# Patient Record
Sex: Female | Born: 1990 | Race: Black or African American | Hispanic: No | Marital: Married | Smoking: Never smoker
Health system: Southern US, Community
[De-identification: ages and names within clinical notes are randomized; demographics above are authoritative.]

## PROBLEM LIST (undated history)

## (undated) DIAGNOSIS — J301 Allergic rhinitis due to pollen: Secondary | ICD-10-CM

## (undated) DIAGNOSIS — R51 Headache: Secondary | ICD-10-CM

## (undated) DIAGNOSIS — R519 Headache, unspecified: Secondary | ICD-10-CM

## (undated) DIAGNOSIS — I10 Essential (primary) hypertension: Secondary | ICD-10-CM

## (undated) HISTORY — PX: NO PAST SURGERIES: SHX2092

## (undated) HISTORY — DX: Headache: R51

## (undated) HISTORY — DX: Essential (primary) hypertension: I10

## (undated) HISTORY — DX: Headache, unspecified: R51.9

## (undated) HISTORY — DX: Allergic rhinitis due to pollen: J30.1

---
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
## 2010-05-19 NOTE — Discharge Summary (Signed)
NAME:  Alexa Hicks, Alexa Hicks             ACCOUNT NO.:  [id number]   MEDICAL RECORD NO.:  [id number]          PATIENT TYPE:  INP   LOCATION:  9102                          FACILITY:  [facility]   PHYSICIAN:  Huel Cote, M.D. DATE OF BIRTH:  07-Dec-1990   DATE OF ADMISSION:  11/27/2007  DATE OF DISCHARGE:  11/29/2007                               DISCHARGE SUMMARY   DISCHARGE DIAGNOSES:  1. Term pregnancy at 39 weeks, delivered.  2. Status post normal spontaneous vaginal delivery.  3. Small for gestational age, although was measuring 20th percentile      on ultrasound.   DISCHARGE MEDICATIONS:  Motrin 600 mg p.o. every 6 hours.   DISCHARGE FOLLOWUP:  The patient is to follow up in my office in 6 weeks  for her routine postpartum exam.   HOSPITAL COURSE:  The patient is a 20 year old, G1, P0, who was admitted  at 44 weeks' gestation with contractions every 2-5 minutes and cervical  change from 2 cm to 3-4.  She was admitted and continued to contract  well.  She have prenatal care complicated by late start at 23 weeks with  relatively poor dating as well as a teen pregnancy.  She is small for  gestational age, infant with estimated fetal weight in the 15th to 20th  percentile on last ultrasound.   PRENATAL LABS:  Are as follows.  B positive antibody negative, rubella  immune, hepatitis B negative, HIV negative, GC negative, chlamydia  negative, and group B strep negative.  One-hour Glucola normal.  She was  too late for any genetic screening.   PAST OBSTETRICAL HISTORY:  None.   PAST GYN HISTORY:  No abnormal Pap smears.   PAST GYN HISTORY:  None.   PAST MEDICAL HISTORY:  None.   No known drug allergies.   MEDICATIONS:  Prenatal vitamins.   Blood pressures 140s/90s.  Cardiac exam is regular rate and rhythm.  On  pelvic exam, she had cervix that was 80 and 4-5 at zero.  Shortly after  admission, she had rupture of membranes performed due to some borderline  blood pressures on  admission, which were probably secondary to pain.  She had pre-eclamptic labs drawn, which were normal.  These blood  pressures normalized after she received her epidural and was  comfortable.  She continued to contract well after rupture of membranes  and reached complete dilation.  She pushed well with a normal  spontaneous vaginal delivery of a vigorous female infant over a first-  degree laceration.  Weight was 5 pounds 8 ounces.  Apgars are 8 and 9.  Placenta delivered spontaneously to a small first-degree laceration,  which was repaired with 2-0 Vicryl for hemostasis.  Estimated blood loss  was 350 mL.  She did great.  By postpartum day #1, her  hemoglobin was 10.1.  Her pain was well controlled.  She had been  postpartum for greater than 24 hours and requested an early discharge.  Therefore, she was allowed discharge home to follow up in the office in  6 weeks.  She was instructed on pelvic rest and birth control and  we  will call to make her appointment.      Huel Cote, M.D.  Electronically Signed     KR/MEDQ  D:  11/29/2007  T:  11/29/2007  Job:  [id number]

## 2010-10-07 LAB — CBC
HCT: 29.5 — ABNORMAL LOW
HCT: 35.7 — ABNORMAL LOW
Hemoglobin: 12.6
MCHC: 34.1
MCV: 84.1
Platelets: 194
RBC: 4.24
RDW: 13.7
WBC: 13.5 — ABNORMAL HIGH

## 2010-10-07 LAB — COMPREHENSIVE METABOLIC PANEL
ALT: 8
Alkaline Phosphatase: 211 — ABNORMAL HIGH
BUN: 3 — ABNORMAL LOW
Chloride: 103
Glucose, Bld: 72
Potassium: 4.3
Sodium: 134 — ABNORMAL LOW
Total Bilirubin: 0.6

## 2010-10-07 LAB — LACTATE DEHYDROGENASE: LDH: 206

## 2010-10-07 LAB — URIC ACID: Uric Acid, Serum: 6.2

## 2014-08-15 ENCOUNTER — Ambulatory Visit (INDEPENDENT_AMBULATORY_CARE_PROVIDER_SITE_OTHER): Payer: BLUE CROSS/BLUE SHIELD | Admitting: Emergency Medicine

## 2014-08-15 VITALS — BP 102/62 | HR 76 | Temp 98.7°F | Resp 14 | Ht 66.0 in | Wt 123.0 lb

## 2014-08-15 DIAGNOSIS — Z0000 Encounter for general adult medical examination without abnormal findings: Secondary | ICD-10-CM

## 2014-08-15 DIAGNOSIS — Z111 Encounter for screening for respiratory tuberculosis: Secondary | ICD-10-CM | POA: Diagnosis not present

## 2014-08-15 NOTE — Progress Notes (Signed)
Subjective:  Patient ID: Alexa Hicks, female    DOB: [birth year]  Age: 24 y.o. 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 She is uncertain of the she's had a hep B immunization by history. She's not been exposed to tuberculosis and she knows of. She takes no medication has no cronic or acut medicl problems  History Alexa Hicks has no past medical history on file.   She has no past surgical history on file.   Her  family history is not on file.  She   reports that she has never smoked. She does not have any smokeless tobacco history on file. She reports that she drinks about 1.2 oz of alcohol per week. She reports that she does not use illicit drugs.  No outpatient prescriptions prior to visit.   No facility-administered medications prior to visit.    Social History   Social History  . Marital Status: Single    Spouse Name: N/A  . Number of Children: N/A  . Years of Education: N/A   Social History Main Topics  . Smoking status: Never Smoker   . Smokeless tobacco: None  . Alcohol Use: 1.2 oz/week    2 Standard drinks or equivalent per week  . Drug Use: No  . Sexual Activity: Not Asked   Other Topics Concern  . None   Social History Narrative  . None     Review of Systems  Constitutional: Negative for fever, chills and appetite change.  HENT: Negative for congestion, ear pain, postnasal drip, sinus pressure and sore throat.   Eyes: Negative for pain and redness.  Respiratory: Negative for cough, shortness of breath and wheezing.   Cardiovascular: Negative for leg swelling.  Gastrointestinal: Negative for nausea, vomiting, abdominal pain, diarrhea, constipation and blood in stool.  Endocrine: Negative for polyuria.  Genitourinary: Negative for dysuria, urgency, frequency and flank pain.  Musculoskeletal: Negative for gait problem.  Skin:  Negative for rash.  Neurological: Negative for weakness and headaches.  Psychiatric/Behavioral: Negative for confusion and decreased concentration. The patient is not nervous/anxious.     Objective:  BP 102/62 mmHg  Pulse 76  Temp(Src) 98.7 F (37.1 C) (Oral)  Resp 14  Ht 5\' 6"  (1.676 m)  Wt 123 lb (55.792 kg)  BMI 19.86 kg/m2  SpO2 98%  Physical Exam  Constitutional: She is oriented to person, place, and time. She appears well-developed and well-nourished. No distress.  HENT:  Head: Normocephalic and atraumatic.  Right Ear: External ear normal.  Left Ear: External ear normal.  Nose: Nose normal.  Eyes: Conjunctivae and EOM are normal. Pupils are equal, round, and reactive to light. No scleral icterus.  Neck: Normal range of motion. Neck supple. No tracheal deviation present.  Cardiovascular: Normal rate, regular rhythm and normal heart sounds.   Pulmonary/Chest: Effort normal. No respiratory distress. She has no wheezes. She has no rales.  Abdominal: She exhibits no mass. There is no tenderness. There is no rebound and no guarding.  Musculoskeletal: She exhibits no edema.  Lymphadenopathy:    She has no cervical adenopathy.  Neurological: She is alert and oriented to person, place, and time. Coordination normal.  Skin: Skin is warm and dry. No rash noted.  Psychiatric: She has a normal mood and affect. Her behavior is normal.      Assessment & Plan:   Alexa Hicks was seen today for annual  exam and immunizations.  Diagnoses and all orders for this visit:  Annual physical exam  Screening examination for pulmonary tuberculosis -     TB Skin Test   Ms. Alexa Hicks does not currently have medications on file.  No orders of the defined types were placed in this encounter.     She was found to have received her complete hepatitis B immunization series 3 shots in 2009.  She knows to return in 2 days for reading her PPD  Appropriate red flag conditions were discussed with the  patient as well as actions that should be taken.  Patient expressed his understanding.  Follow-up: No Follow-up on file.  Alexa Dane, MD

## 2014-08-15 NOTE — Patient Instructions (Signed)

## 2014-08-17 ENCOUNTER — Ambulatory Visit (INDEPENDENT_AMBULATORY_CARE_PROVIDER_SITE_OTHER): Payer: BLUE CROSS/BLUE SHIELD

## 2014-08-17 DIAGNOSIS — Z7689 Persons encountering health services in other specified circumstances: Secondary | ICD-10-CM

## 2014-08-17 DIAGNOSIS — Z111 Encounter for screening for respiratory tuberculosis: Secondary | ICD-10-CM

## 2014-08-17 LAB — TB SKIN TEST
INDURATION: 0 mm
TB SKIN TEST: NEGATIVE

## 2015-12-03 ENCOUNTER — Ambulatory Visit (INDEPENDENT_AMBULATORY_CARE_PROVIDER_SITE_OTHER): Payer: BLUE CROSS/BLUE SHIELD | Admitting: Family Medicine

## 2015-12-03 VITALS — BP 116/72 | HR 83 | Temp 98.2°F | Resp 17 | Ht 66.0 in | Wt 134.0 lb

## 2015-12-03 DIAGNOSIS — Z0000 Encounter for general adult medical examination without abnormal findings: Secondary | ICD-10-CM | POA: Diagnosis not present

## 2015-12-03 LAB — POCT URINALYSIS DIP (MANUAL ENTRY)
BILIRUBIN UA: NEGATIVE
Bilirubin, UA: NEGATIVE
GLUCOSE UA: NEGATIVE
Leukocytes, UA: NEGATIVE
Nitrite, UA: NEGATIVE
Protein Ur, POC: NEGATIVE
RBC UA: NEGATIVE
SPEC GRAV UA: 1.025
Urobilinogen, UA: 0.2
pH, UA: 6.5

## 2015-12-03 LAB — POC MICROSCOPIC URINALYSIS (UMFC): MUCUS RE: ABSENT

## 2015-12-03 LAB — POCT URINE PREGNANCY: Preg Test, Ur: NEGATIVE

## 2015-12-03 NOTE — Patient Instructions (Addendum)
Exercising to Stay Healthy Introduction Exercising regularly is important. It has many health benefits, such as:  Improving your overall fitness, flexibility, and endurance.  Increasing your bone density.  Helping with weight control.  Decreasing your body fat.  Increasing your muscle strength.  Reducing stress and tension.  Improving your overall health. In order to become healthy and stay healthy, it is recommended that you do moderate-intensity and vigorous-intensity exercise. You can tell that you are exercising at a moderate intensity if you have a higher heart rate and faster breathing, but you are still able to hold a conversation. You can tell that you are exercising at a vigorous intensity if you are breathing much harder and faster and cannot hold a conversation while exercising. How often should I exercise? Choose an activity that you enjoy and set realistic goals. Your health care provider can help you to make an activity plan that works for you. Exercise regularly as directed by your health care provider. This may include:  Doing resistance training twice each week, such as:  Push-ups.  Sit-ups.  Lifting weights.  Using resistance bands.  Doing a given intensity of exercise for a given amount of time. Choose from these options:  150 minutes of moderate-intensity exercise every week.  75 minutes of vigorous-intensity exercise every week.  A mix of moderate-intensity and vigorous-intensity exercise every week. Children, pregnant women, people who are out of shape, people who are overweight, and older adults may need to consult a health care provider for individual recommendations. If you have any sort of medical condition, be sure to consult your health care provider before starting a new exercise program. What are some exercise ideas? Some moderate-intensity exercise ideas include:  Walking at a rate of 1 mile in 15  minutes.  Biking.  Hiking.  Golfing.  Dancing. Some vigorous-intensity exercise ideas include:  Walking at a rate of at least 4.5 miles per hour.  Jogging or running at a rate of 5 miles per hour.  Biking at a rate of at least 10 miles per hour.  Lap swimming.  Roller-skating or in-line skating.  Cross-country skiing.  Vigorous competitive sports, such as football, basketball, and soccer.  Jumping rope.  Aerobic dancing. What are some everyday activities that can help me to get exercise?  Yard work, such as:  Child psychotherapistushing a lawn mower.  Raking and bagging leaves.  Washing and waxing your car.  Pushing a stroller.  Shoveling snow.  Gardening.  Washing windows or floors. How can I be more active in my day-to-day activities?  Use the stairs instead of the elevator.  Take a walk during your lunch break.  If you drive, park your car farther away from work or school.  If you take public transportation, get off one stop early and walk the rest of the way.  Make all of your phone calls while standing up and walking around.  Get up, stretch, and walk around every 30 minutes throughout the day. What guidelines should I follow while exercising?  Do not exercise so much that you hurt yourself, feel dizzy, or get very short of breath.  Consult your health care provider before starting a new exercise program.  Wear comfortable clothes and shoes with good support.  Drink plenty of water while you exercise to prevent dehydration or heat stroke. Body water is lost during exercise and must be replaced.  Work out until you breathe faster and your heart beats faster. This information is not intended to  advice given to you by your health care provider. Make sure you discuss any questions you have with your health care provider. Document Released: 01/23/2010 Document Revised: 05/29/2015 Document Reviewed: 05/24/2013  2017 Elsevier     IF you received an x-ray  today, you will receive an invoice from [facility]. Please contact [facility] at [phone] with questions or concerns regarding your invoice.   IF you received labwork today, you will receive an invoice from [facility]. Please contact [facility] at [phone] with questions or concerns regarding your invoice.   Our billing staff will not be able to assist you with questions regarding bills from these companies.  You will be contacted with the lab results as soon as they are available. The fastest way to get your results is to activate your My Chart account. Instructions are located on the last page of this paperwork. If you have not heard from us regarding the results in 2 weeks, please contact this office.     

## 2015-12-03 NOTE — Progress Notes (Signed)
Patient ID: Alexa BillsSharonika M Jones, female    DOB: 1990/11/23, 25 y.o.   MRN: [id number]  PCP: No primary care provider on file.  Chief Complaint  Patient presents with  . Annual Exam    Subjective:   HPI 25 year old female presents for work-related physical. [facility]  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 Denies any chronic medical problems and reports overall good health.  Social History   Social History  . Marital status: Single    Spouse name: N/A  . Number of children: N/A  . Years of education: N/A   Occupational History  . Not on file.   Social History Main Topics  . Smoking status: Never Smoker  . Smokeless tobacco: Not on file  . Alcohol use 1.2 oz/week    2 Standard drinks or equivalent per week  . Drug use: No  . Sexual activity: Not on file   Other Topics Concern  . Not on file   Social History Narrative  . No narrative on file    Family History  Problem Relation Age of Onset  . Cancer Maternal Grandfather     Immunization History  Administered Date(s) Administered  . PPD Test 08/15/2014    Tuberculosis Risk Questionnaire  1. NO Were you born outside the [place] in one of the following parts of the world: [place], [place], [place], [place] or [place]?    2. NO Have you traveled outside the [place] and lived for more than one month in one of the following parts of the world: [place], [place], [place], [place] or [place]?    3. NO Do you have a compromised immune system such as from any of the following conditions:HIV/AIDS, organ or bone marrow transplantation, diabetes, immunosuppressive medicines (e.g. Prednisone, Remicaide), leukemia, lymphoma, cancer of the head or neck, gastrectomy or jejunal bypass, end-stage renal disease (on dialysis), or silicosis?     4. NO Have you ever or do you plan on  working in: a residential care center, a health care facility, a jail or prison or homeless shelter?    5. NO Have you ever: injected illegal drugs, used crack cocaine, lived in a homeless shelter  or been in jail or prison?     6. NO Have you ever been exposed to anyone with infectious tuberculosis?    Tuberculosis Symptom Questionnaire  Do you currently have any of the following symptoms?  1. NO :Unexplained cough lasting more than 3 weeks?   2. NO: Unexplained fever lasting more than 3 weeks.   3. NO: Night Sweats (sweating that leaves the bedclothes and sheets wet)     4. XB:[id number]:Shortness of Breath   5. NO: Chest Pain   6. NO: Unintentional weight loss    7. NO: Unexplained fatigue (very tired for no reason)     Review of Systems See HPI   There are no active problems to display for this patient.    Prior to Admission medications   Not on File     No Known Allergies     Objective:  Physical Exam  Constitutional: She is oriented to person, place, and time. She appears well-developed and well-nourished.  HENT:  Head: Normocephalic and atraumatic.  Right Ear: External ear normal.  Left Ear: External ear normal.  Nose: Nose normal.  Mouth/Throat: Oropharynx is clear and moist.  Eyes: Conjunctivae and EOM are normal. Pupils are equal, round, and reactive to light.  Neck: Normal range of motion. Neck supple. No thyromegaly present.  Cardiovascular: Normal rate, regular rhythm, normal heart sounds and intact distal pulses.   Pulmonary/Chest: Effort normal and breath sounds normal.  Abdominal: Soft. Bowel sounds are normal.  Genitourinary:  Genitourinary Comments: Deferred. Obtains GYN care at OB/GYN  Musculoskeletal: Normal range of motion.  Lymphadenopathy:    She has no cervical adenopathy.  Neurological: She is alert and oriented to person, place, and time. She has normal reflexes.  Skin: Skin is warm and dry.  Psychiatric: She has a normal mood and  affect. Her behavior is normal. Judgment and thought content normal.   Vitals:   12/03/15 1600  BP: 116/72  Pulse: 83  Resp: 17  Temp: 98.2 F (36.8 C)     Assessment & Plan:  1. Physical exam - POCT Microscopic Urinalysis ([facility]) - POCT urinalysis dipstick - POCT urine pregnancy Plan: -Completely unremarkable physical exam. -Last PAP 2016 advised to obtain next PAP in 2019 -Next TDAP due  June 2019 -Work form for [employer] Completed with TB Free Letter attached.  Godfrey PickKimberly S. Tiburcio PeaHarris, MSN, FNP-C [facility]

## 2016-05-14 ENCOUNTER — Ambulatory Visit (INDEPENDENT_AMBULATORY_CARE_PROVIDER_SITE_OTHER): Payer: BLUE CROSS/BLUE SHIELD | Admitting: Physician Assistant

## 2016-05-14 ENCOUNTER — Encounter: Payer: Self-pay | Admitting: Physician Assistant

## 2016-05-14 ENCOUNTER — Telehealth: Payer: Self-pay | Admitting: *Deleted

## 2016-05-14 VITALS — BP 114/64 | HR 74 | Temp 98.3°F | Resp 16 | Ht 66.0 in | Wt 135.2 lb

## 2016-05-14 DIAGNOSIS — N760 Acute vaginitis: Secondary | ICD-10-CM | POA: Diagnosis not present

## 2016-05-14 DIAGNOSIS — N898 Other specified noninflammatory disorders of vagina: Secondary | ICD-10-CM

## 2016-05-14 DIAGNOSIS — B373 Candidiasis of vulva and vagina: Secondary | ICD-10-CM

## 2016-05-14 DIAGNOSIS — B3731 Acute candidiasis of vulva and vagina: Secondary | ICD-10-CM

## 2016-05-14 DIAGNOSIS — B9689 Other specified bacterial agents as the cause of diseases classified elsewhere: Secondary | ICD-10-CM | POA: Diagnosis not present

## 2016-05-14 LAB — POCT WET + KOH PREP
Trich by wet prep: ABSENT
Yeast by KOH: ABSENT
Yeast by wet prep: ABSENT

## 2016-05-14 MED ORDER — FLUCONAZOLE 150 MG PO TABS: 150.0000 mg | ORAL_TABLET | Freq: Once | ORAL | 0 refills | Status: AC

## 2016-05-14 MED ORDER — METRONIDAZOLE 500 MG PO TABS: 500.0000 mg | ORAL_TABLET | Freq: Three times a day (TID) | ORAL | 0 refills | Status: DC

## 2016-05-14 MED ORDER — METRONIDAZOLE 500 MG PO TABS: 500.0000 mg | ORAL_TABLET | Freq: Two times a day (BID) | ORAL | 0 refills | Status: AC

## 2016-05-14 NOTE — Patient Instructions (Addendum)
Please take one diflucan now, and take the second one after the metronidazole.    Bacterial Vaginosis Bacterial vaginosis is an infection of the vagina. It happens when too many germs (bacteria) grow in the vagina. This infection puts you at risk for infections from sex (STIs). Treating this infection can lower your risk for some STIs. You should also treat this if you are pregnant. It can cause your baby to be born early. Follow these instructions at home: Medicines   Take over-the-counter and prescription medicines only as told by your doctor.  Take or use your antibiotic medicine as told by your doctor. Do not stop taking or using it even if you start to feel better. General instructions   If you your sexual partner is a woman, tell her that you have this infection. She needs to get treatment if she has symptoms. If you have a female partner, he does not need to be treated.  During treatment:  Avoid sex.  Do not douche.  Avoid alcohol as told.  Avoid breastfeeding as told.  Drink enough fluid to keep your pee (urine) clear or pale yellow.  Keep your vagina and butt (rectum) clean.  Wash the area with warm water every day.  Wipe from front to back after you use the toilet.  Keep all follow-up visits as told by your doctor. This is important. Preventing this condition   Do not douche.  Use only warm water to wash around your vagina.  Use protection when you have sex. This includes:  Latex condoms.  Dental dams.  Limit how many people you have sex with. It is best to only have sex with the same person (be monogamous).  Get tested for STIs. Have your partner get tested.  Wear underwear that is cotton or lined with cotton.  Avoid tight pants and pantyhose. This is most important in summer.  Do not use any products that have nicotine or tobacco in them. These include cigarettes and e-cigarettes. If you need help quitting, ask your doctor.  Do not use illegal  drugs.  Limit how much alcohol you drink. Contact a doctor if:  Your symptoms do not get better, even after you are treated.  You have more discharge or pain when you pee (urinate).  You have a fever.  You have pain in your belly (abdomen).  You have pain with sex.  Your bleed from your vagina between periods. Summary  This infection happens when too many germs (bacteria) grow in the vagina.  Treating this condition can lower your risk for some infections from sex (STIs).  You should also treat this if you are pregnant. It can cause early (premature) birth.  Do not stop taking or using your antibiotic medicine even if you start to feel better. This information is not intended to replace advice given to you by your health care provider. Make sure you discuss any questions you have with your health care provider. Document Released: 09/30/2007 Document Revised: 09/06/2015 Document Reviewed: 09/06/2015 Elsevier Interactive Patient Education  2017 [employer]    IF you received an x-ray today, you will receive an invoice from [facility]. Please contact [facility] at [phone] with questions or concerns regarding your invoice.   IF you received labwork today, you will receive an invoice from [facility]. Please contact [facility] at [phone] with questions or concerns regarding your invoice.   Our billing staff will not be able to assist you with questions regarding bills from these companies.  You will be contacted with the lab results as soon as they are available. The fastest way to get your results is to activate your My Chart account. Instructions are located on the last page of this paperwork. If you have not heard from [place] regarding the results in 2 weeks, please contact this office.

## 2016-05-14 NOTE — Telephone Encounter (Signed)
[facility] Michele McalpinePhil, [facility] [address] per [place], [place], cancel Rx for Metronidazole tid.

## 2016-05-14 NOTE — Progress Notes (Signed)
[facility] [address], [place] [address] [phone]  Date:  05/14/2016   Name:  Alexa Hicks   DOB:  [birth year]   MRN:  [id number]  PCP:  Bing NeighborsHarris, Kimberly S, FNP    History of Present Illness:  Alexa Hicks is a 26 y.o. female patient who presents to PCP with  Chief Complaint  Patient presents with  . vaginal issues    x2 days; possible yeast infection.  Tried a new Dove soap and may have caused irriation     --2 days of discharge.  Initially white curd like non-odorous discharge.  She attempted an over the counter yeast medication suppository.  She reports her symptoms worsened to itching, and heavy discharge.  No dysuria, hematuria, or frequency.   --she is sexually active, unprotected.   --no abdominal pain  There are no active problems to display for this patient.   History reviewed. No pertinent past medical history.  History reviewed. No pertinent surgical history.  Social History  Substance Use Topics  . Smoking status: Never Smoker  . Smokeless tobacco: Never Used  . Alcohol use 1.2 oz/week    2 Standard drinks or equivalent per week    Family History  Problem Relation Age of Onset  . Cancer Maternal Grandfather     No Known Allergies  Medication list has been reviewed and updated.  No current outpatient prescriptions on file prior to visit.   No current facility-administered medications on file prior to visit.     ROS ROS otherwise unremarkable unless listed above.  Physical Examination: BP 114/64   Pulse 74   Temp 98.3 F (36.8 C) (Oral)   Resp 16   Ht 5\' 6"  (1.676 m)   Wt 135 lb 3.2 oz (61.3 kg)   SpO2 100%   BMI 21.82 kg/m  Ideal Body Weight: Weight in (lb) to have BMI = 25: 154.6  Physical Exam  Constitutional: She is oriented to person, place, and time. She appears well-developed and well-nourished. No distress.  HENT:  Head: Normocephalic and atraumatic.  Right Ear: External ear normal.  Left Ear:  External ear normal.  Eyes: Conjunctivae and EOM are normal. Pupils are equal, round, and reactive to light.  Cardiovascular: Normal rate.   Pulmonary/Chest: Effort normal. No respiratory distress.  Genitourinary: Pelvic exam was performed with patient supine. There is no rash on the right labia. There is no rash on the left labia. Cervix exhibits discharge. Cervix exhibits no friability. Right adnexum displays no mass. Left adnexum displays no mass. Vaginal discharge found.  Neurological: She is alert and oriented to person, place, and time.  Skin: She is not diaphoretic.  Psychiatric: She has a normal mood and affect. Her behavior is normal.     Assessment and Plan: Alexa Hicks is a 26 y.o. female who is here today for cc of vaginal discharge. BV (bacterial vaginosis) - Plan: fluconazole (DIFLUCAN) 150 MG tablet, metroNIDAZOLE (FLAGYL) 500 MG tablet, DISCONTINUED: metroNIDAZOLE (FLAGYL) 500 MG tablet  Yeast infection of the vagina  Vaginal discharge - Plan: POCT Wet + KOH Prep, fluconazole (DIFLUCAN) 150 MG tablet, DISCONTINUED: metroNIDAZOLE (FLAGYL) 500 MG tablet  Trena PlattStephanie Cael Worth, PA-C [facility] 5/13/20188:47 AM

## 2016-10-04 ENCOUNTER — Ambulatory Visit: Payer: BLUE CROSS/BLUE SHIELD | Admitting: Physician Assistant

## 2016-10-08 ENCOUNTER — Encounter: Payer: Self-pay | Admitting: Family Medicine

## 2016-10-08 ENCOUNTER — Ambulatory Visit (INDEPENDENT_AMBULATORY_CARE_PROVIDER_SITE_OTHER): Payer: BLUE CROSS/BLUE SHIELD | Admitting: Family Medicine

## 2016-10-08 VITALS — BP 108/70 | HR 57 | Temp 98.0°F | Resp 16 | Ht 66.54 in | Wt 139.0 lb

## 2016-10-08 DIAGNOSIS — L509 Urticaria, unspecified: Secondary | ICD-10-CM

## 2016-10-08 MED ORDER — METHYLPREDNISOLONE SODIUM SUCC 125 MG IJ SOLR
80.0000 mg | Freq: Once | INTRAMUSCULAR | Status: AC
  Administered 2016-10-08: 80 mg via INTRAMUSCULAR

## 2016-10-08 NOTE — Patient Instructions (Addendum)
   IF you received an x-ray today, you will receive an invoice from [facility]. Please contact [facility] at [phone] with questions or concerns regarding your invoice.   IF you received labwork today, you will receive an invoice from [facility]. Please contact [facility] at [phone] with questions or concerns regarding your invoice.   Our billing staff will not be able to assist you with questions regarding bills from these companies.  You will be contacted with the lab results as soon as they are available. The fastest way to get your results is to activate your My Chart account. Instructions are located on the last page of this paperwork. If you have not heard from us regarding the results in 2 weeks, please contact this office.     Hives Hives (urticaria) are itchy, red, swollen areas on your skin. Hives can appear on any part of your body and can vary in size. They can be as small as the tip of a pen or much larger. Hives often fade within 24 hours (acute hives). In other cases, new hives appear after old ones fade. This cycle can continue for several days or weeks (chronic hives). Hives result from your body's reaction to an irritant or to something that you are allergic to (trigger). When you are exposed to a trigger, your body releases a chemical (histamine) that causes redness, itching, and swelling. You can get hives immediately after being exposed to a trigger or hours later. Hives do not spread from person to person (are not contagious). Your hives may get worse with scratching, exercise, and emotional stress. What are the causes? Causes of this condition include:  Allergies to certain foods or ingredients.  Insect bites or stings.  Exposure to pollen or pet dander.  Contact with latex or chemicals.  Spending time in sunlight, heat, or cold (exposure).  Exercise.  Stress.  You can also get hives from some medical conditions and treatments. These  include:  Viruses, including the common cold.  Bacterial infections, such as urinary tract infections and strep throat.  Disorders such as vasculitis, lupus, or thyroid disease.  Certain medications.  Allergy shots.  Blood transfusions.  Sometimes, the cause of hives is not known (idiopathic hives). What increases the risk? This condition is more likely to develop in:  Women.  People who have food allergies, especially to citrus fruits, milk, eggs, peanuts, tree nuts, or shellfish.  People who are allergic to: ? Medicines. ? Latex. ? Insects. ? Animals. ? Pollen.  People who have certain medical conditions, includinglupus or thyroid disease.  What are the signs or symptoms? The main symptom of this condition is raised, itchyred or white bumps or patches on your skin. These areas may:  Become large and swollen (welts).  Change in shape and location, quickly and repeatedly.  Be separate hives or connect over a large area of skin.  Sting or become painful.  Turn white when pressed in the center (blanch).  In severe cases, yourhands, feet, and face may also become swollen. This may occur if hives develop deeper in your skin. How is this diagnosed? This condition is diagnosed based on your symptoms, medical history, and physical exam. Your skin, urine, or blood may be tested to find out what is causing your hives and to rule out other health issues. Your health care provider may also remove a small sample of skin from the affected area and examine it under a microscope (biopsy). How is this treated? Treatment   depends on the severity of your condition. Your health care provider may recommend using cool, wet cloths (cool compresses) or taking cool showers to relieve itching. Hives are sometimes treated with medicines, including:  Antihistamines.  Corticosteroids.  Antibiotics.  An injectable medicine (omalizumab). Your health care provider may prescribe this if you  have chronic idiopathic hives and you continue to have symptoms even after treatment with antihistamines.  Severe cases may require an emergency injection of adrenaline (epinephrine) to prevent a life-threatening allergic reaction (anaphylaxis). Follow these instructions at home: Medicines  Take or apply over-the-counter and prescription medicines only as told by your health care provider.  If you were prescribed an antibiotic medicine, use it as told by your health care provider. Do not stop taking the antibiotic even if you start to feel better. Skin Care  Apply cool compresses to the affected areas.  Do not scratch or rub your skin. General instructions  Do not take hot showers or baths. This can make itching worse.  Do not wear tight-fitting clothing.  Use sunscreen and wear protective clothing when you are outside.  Avoid any substances that cause your hives. Keep a journal to help you track what causes your hives. Write down: ? What medicines you take. ? What you eat and drink. ? What products you use on your skin.  Keep all follow-up visits as told by your health care provider. This is important. Contact a health care provider if:  Your symptoms are not controlled with medicine.  Your joints are painful or swollen. Get help right away if:  You have a fever.  You have pain in your abdomen.  Your tongue or lips are swollen.  Your eyelids are swollen.  Your chest or throat feels tight.  You have trouble breathing or swallowing. These symptoms may represent a serious problem that is an emergency. Do not wait to see if the symptoms will go away. Get medical help right away. Call your local emergency services (911 in the [place] Do not drive yourself to the hospital. This information is not intended to replace advice given to you by your health care provider. Make sure you discuss any questions you have with your health care provider. Document Released: 12/21/2004  Document Revised: 05/21/2015 Document Reviewed: 10/09/2014 Elsevier Interactive Patient Education  2017 Elsevier Inc.  

## 2016-10-08 NOTE — Progress Notes (Signed)
   10/5/20184:02 PM  Alena Bills Aug 17, 1990, 26 y.o. female [id number]  Chief Complaint  Patient presents with  . Rash    on both arms that are red and itchy. Pt states they have been popping up since Sat.     HPI:   Patient is a 26 y.o. female who presents today for hives on arms and legs for past week. Started after she stayed in a cabin. Tried OTC topical hydrocortisone and benadryl without benefit. Very itchy.   Depression screen [facility] 2/9 10/08/2016 05/14/2016 12/03/2015  Decreased Interest 0 0 0  Down, Depressed, Hopeless 0 0 0  PHQ - 2 Score 0 0 0    No Known Allergies  Prior to Admission medications   Not on File    History reviewed. No pertinent past medical history.  History reviewed. No pertinent surgical history.  Social History  Substance Use Topics  . Smoking status: Never Smoker  . Smokeless tobacco: Never Used  . Alcohol use 1.2 oz/week    2 Standard drinks or equivalent per week    Family History  Problem Relation Age of Onset  . Cancer Maternal Grandfather     Review of Systems  Constitutional: Negative for chills and fever.  Respiratory: Negative for cough, shortness of breath, wheezing and stridor.   Gastrointestinal: Negative for nausea and vomiting.  Skin: Positive for itching and rash.     OBJECTIVE:  Blood pressure 108/70, pulse (!) 57, temperature 98 F (36.7 C), temperature source Oral, resp. rate 16, height 5' 6.53" (1.69 m), weight 139 lb (63 kg), SpO2 98 %.  Physical Exam  Constitutional: She is oriented to person, place, and time and well-developed, well-nourished, and in no distress.  HENT:  Head: Normocephalic and atraumatic.  Mouth/Throat: Oropharynx is clear and moist. No oropharyngeal exudate.  Eyes: Pupils are equal, round, and reactive to light. EOM are normal. No scleral icterus.  Neck: Neck supple.  Cardiovascular: Normal rate, regular rhythm and normal heart sounds.  Exam reveals no gallop and no friction rub.     No murmur heard. Pulmonary/Chest: Effort normal and breath sounds normal. She has no wheezes. She has no rales.  Musculoskeletal: She exhibits no edema.  Neurological: She is alert and oriented to person, place, and time. Gait normal.  Skin: Skin is warm and dry. Rash (scattered hives over hands and BUE and infrequent over BLE) noted.    ASSESSMENT and PLAN  1. Hives Conservative measures discussed. Dose of solumedrol today, r/se/b discussed. Patient educational handout. - methylPREDNISolone sodium succinate (SOLU-MEDROL) 125 mg/2 mL injection 80 mg; Inject 1.28 mLs (80 mg total) into the muscle once.  Return if symptoms worsen or fail to improve.    Myles Lipps, MD Primary Care at [facility] [address] [place], [place] [address] Ph.  [phone] Fax [phone]

## 2017-04-05 ENCOUNTER — Encounter: Payer: Self-pay | Admitting: Physician Assistant

## 2017-07-28 ENCOUNTER — Ambulatory Visit: Payer: BC Managed Care – PPO | Admitting: Family Medicine

## 2017-07-28 DIAGNOSIS — Z0289 Encounter for other administrative examinations: Secondary | ICD-10-CM

## 2017-08-19 ENCOUNTER — Ambulatory Visit: Payer: BC Managed Care – PPO | Admitting: Family Medicine

## 2017-09-14 ENCOUNTER — Encounter: Payer: Self-pay | Admitting: Family Medicine

## 2017-09-14 ENCOUNTER — Ambulatory Visit: Payer: BC Managed Care – PPO | Admitting: Family Medicine

## 2017-09-14 VITALS — BP 126/82 | Temp 98.6°F | Ht 65.0 in | Wt 146.0 lb

## 2017-09-14 DIAGNOSIS — Z0000 Encounter for general adult medical examination without abnormal findings: Secondary | ICD-10-CM

## 2017-09-14 DIAGNOSIS — Z889 Allergy status to unspecified drugs, medicaments and biological substances status: Secondary | ICD-10-CM | POA: Diagnosis not present

## 2017-09-14 DIAGNOSIS — Z975 Presence of (intrauterine) contraceptive device: Secondary | ICD-10-CM | POA: Diagnosis not present

## 2017-09-14 NOTE — Progress Notes (Signed)
Subjective:     Alexa Hicks is a 27 y.o. female and is here for a comprehensive physical exam. The patient reports problems - concern about bp.  History of allergies: -We will take Claritin as needed. -Symptoms mostly occur when out of town. -Symptoms include itchy eyes, rhinorrhea  Concerns about BP: -Was told her blood pressure should be checked at the dentist office. -Patient checked her BP at the drugstore and noted it was low 90s systolic. -Patient denies history of blood pressure issues. -May drink 1 bottle of water per day -Endorses eating out a lot.  Birth control: -Nexplanon in place.  Due for removal in December 2019. -LMP July 31, 2017.  Pt endorsed heavy bleeding -In the past pt tried OCPs, patch, IUD (got PID)   Social hx: Pt is single.  She is engaged.  Wedding planned for Spring 2020.  Pt has a 4 yo daughter.  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  Patient endorses social alcohol use.  Patient denies tobacco and drug use.  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.     Social History   Socioeconomic History  . Marital status: Single    Spouse name: Not on file  . Number of children: Not on file  . Years of education: Not on file  . Highest education level: Not on file  Occupational History  . Not on file  Social Needs  . Financial resource strain: Not on file  . Food insecurity:    Worry: Not on file    Inability: Not on file  . Transportation needs:    Medical: Not on file    Non-medical: Not on file  Tobacco Use  . Smoking status: Never Smoker  . Smokeless tobacco: Never Used  Substance and Sexual Activity  . Alcohol use: Yes    Alcohol/week: 2.0 standard drinks    Types: 2 Standard drinks or equivalent per week  . Drug use: No  . Sexual activity: Yes  Lifestyle  . Physical activity:    Days per week: Not on file    Minutes per session: Not on file  . Stress: Not on file   Relationships  . Social connections:    Talks on phone: Not on file    Gets together: Not on file    Attends religious service: Not on file    Active member of club or organization: Not on file    Attends meetings of clubs or organizations: Not on file    Relationship status: Not on file  . Intimate partner violence:    Fear of current or ex partner: Not on file    Emotionally abused: Not on file    Physically abused: Not on file    Forced sexual activity: Not on file  Other Topics Concern  . Not on file  Social History Narrative  . Not on file   Health Maintenance  Topic Date Due  . HIV Screening  06/19/2004  . TETANUS/TDAP  06/19/2008  . INFLUENZA VACCINE  08/04/2017  . PAP SMEAR  08/25/2018    The following portions of the patient's history were reviewed and updated as appropriate: allergies, current medications, past family history, past medical history, past social history, past surgical history and problem list.  Review of Systems A comprehensive review of systems was negative.   Objective:    BP 126/82   Temp 98.6 F (37 C) (Oral)   Ht 5\' 5"  (1.651  m)   Wt 146 lb (66.2 kg)   LMP 07/31/2017 (Exact Date)   BMI 24.30 kg/m  General appearance: alert, cooperative and no distress Head: Normocephalic, without obvious abnormality, atraumatic Eyes: conjunctivae/corneas clear. PERRL, EOM's intact. Fundi benign. Ears: normal TM's and external ear canals both ears Nose: Nares normal. Septum midline. Mucosa normal. No drainage or sinus tenderness. Throat: lips, mucosa, and tongue normal; teeth and gums normal Neck: no adenopathy, no JVD, supple, symmetrical, trachea midline and thyroid not enlarged, symmetric, no tenderness/mass/nodules Lungs: clear to auscultation bilaterally Heart: regular rate and rhythm, S1, S2 normal, no murmur, click, rub or gallop Abdomen: soft, non-tender; bowel sounds normal; no masses,  no organomegaly Extremities: extremities normal,  atraumatic, no cyanosis or edema Skin: Skin color, texture, turgor normal. No rashes or lesions Neurologic: Alert and oriented X 3, normal strength and tone. Normal symmetric reflexes. Normal coordination and gait    Assessment:    Healthy female exam.      Plan:     Anticipatory guidance given including wearing seatbelts, smoke detectors in the home, increasing physical activity, increasing p.o. intake of water and vegetables. -given handout -Influenza vaccine recommended -nexplanon in place--due for removal in Dec 2019, followed by OB/Gyn.  Discussed other birth control options.   See After Visit Summary for Counseling Recommendations  -Next CPE in 1 year  Allergies -continue OTC claritin prn  Follow-up PRN  Abbe Amsterdam, MD

## 2017-09-14 NOTE — Patient Instructions (Addendum)
Preventive Care 18-39 Years, Female Preventive care refers to lifestyle choices and visits with your health care provider that can promote health and wellness. What does preventive care include?  A yearly physical exam. This is also called an annual well check.  Dental exams once or twice a year.  Routine eye exams. Ask your health care provider how often you should have your eyes checked.  Personal lifestyle choices, including: ? Daily care of your teeth and gums. ? Regular physical activity. ? Eating a healthy diet. ? Avoiding tobacco and drug use. ? Limiting alcohol use. ? Practicing safe sex. ? Taking vitamin and mineral supplements as recommended by your health care provider. What happens during an annual well check? The services and screenings done by your health care provider during your annual well check will depend on your age, overall health, lifestyle risk factors, and family history of disease. Counseling Your health care provider may ask you questions about your:  Alcohol use.  Tobacco use.  Drug use.  Emotional well-being.  Home and relationship well-being.  Sexual activity.  Eating habits.  Work and work Statistician.  Method of birth control.  Menstrual cycle.  Pregnancy history.  Screening You may have the following tests or measurements:  Height, weight, and BMI.  Diabetes screening. This is done by checking your blood sugar (glucose) after you have not eaten for a while (fasting).  Blood pressure.  Lipid and cholesterol levels. These may be checked every 5 years starting at age 66.  Skin check.  Hepatitis C blood test.  Hepatitis B blood test.  Sexually transmitted disease (STD) testing.  BRCA-related cancer screening. This may be done if you have a family history of breast, ovarian, tubal, or peritoneal cancers.  Pelvic exam and Pap test. This may be done every 3 years starting at age 40. Starting at age 59, this may be done every 5  years if you have a Pap test in combination with an HPV test.  Discuss your test results, treatment options, and if necessary, the need for more tests with your health care provider. Vaccines Your health care provider may recommend certain vaccines, such as:  Influenza vaccine. This is recommended every year.  Tetanus, diphtheria, and acellular pertussis (Tdap, Td) vaccine. You may need a Td booster every 10 years.  Varicella vaccine. You may need this if you have not been vaccinated.  HPV vaccine. If you are 69 or younger, you may need three doses over 6 months.  Measles, mumps, and rubella (MMR) vaccine. You may need at least one dose of MMR. You may also need a second dose.  Pneumococcal 13-valent conjugate (PCV13) vaccine. You may need this if you have certain conditions and were not previously vaccinated.  Pneumococcal polysaccharide (PPSV23) vaccine. You may need one or two doses if you smoke cigarettes or if you have certain conditions.  Meningococcal vaccine. One dose is recommended if you are age 27-21 years and a first-year college student living in a residence hall, or if you have one of several medical conditions. You may also need additional booster doses.  Hepatitis A vaccine. You may need this if you have certain conditions or if you travel or work in places where you may be exposed to hepatitis A.  Hepatitis B vaccine. You may need this if you have certain conditions or if you travel or work in places where you may be exposed to hepatitis B.  Haemophilus influenzae type b (Hib) vaccine. You may need this if  you have certain risk factors.  Talk to your health care provider about which screenings and vaccines you need and how often you need them. This information is not intended to replace advice given to you by your health care provider. Make sure you discuss any questions you have with your health care provider. Document Released: 02/16/2001 Document Revised: 09/10/2015  Document Reviewed: 10/22/2014 Elsevier Interactive Patient Education  2018 [place] 18-39 Years, Female Preventive care refers to lifestyle choices and visits with your health care provider that can promote health and wellness. What does preventive care include?  A yearly physical exam. This is also called an annual well check.  Dental exams once or twice a year.  Routine eye exams. Ask your health care provider how often you should have your eyes checked.  Personal lifestyle choices, including: ? Daily care of your teeth and gums. ? Regular physical activity. ? Eating a healthy diet. ? Avoiding tobacco and drug use. ? Limiting alcohol use. ? Practicing safe sex. ? Taking vitamin and mineral supplements as recommended by your health care provider. What happens during an annual well check? The services and screenings done by your health care provider during your annual well check will depend on your age, overall health, lifestyle risk factors, and family history of disease. Counseling Your health care provider may ask you questions about your:  Alcohol use.  Tobacco use.  Drug use.  Emotional well-being.  Home and relationship well-being.  Sexual activity.  Eating habits.  Work and work Statistician.  Method of birth control.  Menstrual cycle.  Pregnancy history.  Screening You may have the following tests or measurements:  Height, weight, and BMI.  Diabetes screening. This is done by checking your blood sugar (glucose) after you have not eaten for a while (fasting).  Blood pressure.  Lipid and cholesterol levels. These may be checked every 5 years starting at age 76.  Skin check.  Hepatitis C blood test.  Hepatitis B blood test.  Sexually transmitted disease (STD) testing.  BRCA-related cancer screening. This may be done if you have a family history of breast, ovarian, tubal, or peritoneal cancers.  Pelvic exam and Pap test.  This may be done every 3 years starting at age 46. Starting at age 55, this may be done every 5 years if you have a Pap test in combination with an HPV test.  Discuss your test results, treatment options, and if necessary, the need for more tests with your health care provider. Vaccines Your health care provider may recommend certain vaccines, such as:  Influenza vaccine. This is recommended every year.  Tetanus, diphtheria, and acellular pertussis (Tdap, Td) vaccine. You may need a Td booster every 10 years.  Varicella vaccine. You may need this if you have not been vaccinated.  HPV vaccine. If you are 2 or younger, you may need three doses over 6 months.  Measles, mumps, and rubella (MMR) vaccine. You may need at least one dose of MMR. You may also need a second dose.  Pneumococcal 13-valent conjugate (PCV13) vaccine. You may need this if you have certain conditions and were not previously vaccinated.  Pneumococcal polysaccharide (PPSV23) vaccine. You may need one or two doses if you smoke cigarettes or if you have certain conditions.  Meningococcal vaccine. One dose is recommended if you are age 53-21 years and a first-year college student living in a residence hall, or if you have one of several medical conditions. You may also need  additional booster doses.  Hepatitis A vaccine. You may need this if you have certain conditions or if you travel or work in places where you may be exposed to hepatitis A.  Hepatitis B vaccine. You may need this if you have certain conditions or if you travel or work in places where you may be exposed to hepatitis B.  Haemophilus influenzae type b (Hib) vaccine. You may need this if you have certain risk factors.  Talk to your health care provider about which screenings and vaccines you need and how often you need them. This information is not intended to replace advice given to you by your health care provider. Make sure you discuss any questions you  have with your health care provider. Document Released: 02/16/2001 Document Revised: 09/10/2015 Document Reviewed: 10/22/2014 Elsevier Interactive Patient Education  2018 Elsevier Inc.  

## 2018-03-20 ENCOUNTER — Telehealth: Payer: Self-pay | Admitting: Family Medicine

## 2018-03-20 NOTE — Telephone Encounter (Signed)
Copied from CRM [id number]. Topic: General - Inquiry >> Mar 20, 2018  3:20 PM Maia Petties wrote: Reason for CRM: Pt returned from [place] this morning. She was in [place] since Thursday 03/16/2018. Pt is wanting to have COVID-19 testing because she has a 29 year old child. Her work advised her of needing to do a 14 day self quarantine. Pt saw on [facility] website "no one will be tested without an order from a physician" and is requesting an order. Pt currently having no symptoms of cough, fever, sob. Please advise

## 2018-03-20 NOTE — Telephone Encounter (Signed)
LMTCB Per guidelines pt is not HIGH RISK for COVID-19. We are unable to test her at this time. If symptoms develop she can call office for recommendations.  Are you at risk for the Coronavirus (COVID-19)? To be considered HIGH RISK for Coronavirus (COVID-19), you have to meet the following criteria: - Traveled to [place], [place], [place], [place] or [place]; or in the [place] to [place], [place], [place], or [place]; AND have fever, cough, and shortness of breath within the last 2 weeks of travel  OR - Been in close contact with a person diagnosed with COVID-19 within the last 2 weeks AND have fever, cough, and shortness of breath; - IF YOU DO NOT MEET THESE CRITERIA, YOU ARE CONSIDERED LOW RISK FOR COVID-19.  What to do if you are HIGH RISK for COVID-19? - If you are having a medical emergency, call 911. - Seek medical care right away. Before you go to a doctors office, urgent care or emergency department, call ahead and tell them about your recent travel, contact with someone diagnosed with COVID-19, and your symptoms. You should receive instructions from your physicians office regarding next steps of care. - When you arrive at healthcare provider, tell the healthcare staff immediately you have returned from visiting [place], [place], [place], [place] or [place]; or traveled in the [place] to [place], [place], [place], or [place]; in the last two weeks or you have been in close contact with a person diagnosed with COVID-19 in the last 2 weeks. - Tell the health care staff about your symptoms: fever, cough and shortness of breath. - After you have been seen by a medical provider, you will be either: - Tested for (COVID-19) and discharged home on quarantine except to seek medical care if symptoms worsen, and asked to - Stay home and avoid contact with others until you get your results (4-5 days) - Avoid travel on public transportation if possible (such as bus, train, or  airplane) or - Sent to the Emergency Department by EMS for evaluation, COVID-19 testing, and possible admission depending on your condition and test results.  What to do if you are LOW RISK for COVID-19? Reduce your risk of any infection by using the same precautions used for avoiding the common cold or flu: - Wash your hands often with soap and warm water for at least 20 seconds. If soap and water are not readily available, use an alcohol-based hand sanitizer with at least 60% alcohol. - If coughing or sneezing, cover your mouth and nose by coughing or sneezing into the elbow areas of your shirt or coat, into a tissue or into your sleeve (not your hands). - Avoid shaking hands with others and consider head nods or verbal greetings only. - Avoid touching your eyes, nose, or mouth with unwashed hands. - Avoid close contact with people who are sick. - Avoid places or events with large numbers of people in one location, like concerts or sporting events. - Carefully consider travel plans you have or are making. - If you are planning any travel outside or inside the [place], visit the CDC's Travelers' Health webpage for the latest health notices. - If you have some symptoms but not all symptoms, continue to monitor at home and seek medical attention if your symptoms worsen. - If you are having a medical emergency, call 911.  If you meet these criteria please let us know, otherwise please continue to treat your symptoms with over the  counter medications. If your symptoms worsen please call our office.

## 2018-07-19 ENCOUNTER — Telehealth: Payer: Self-pay | Admitting: Family Medicine

## 2018-07-19 NOTE — Telephone Encounter (Signed)
Pt has been scheduled for 08/09/2018 for a CPE/PPD.

## 2018-07-19 NOTE — Telephone Encounter (Signed)
Patient called wanting to got a physical or a TB test done for her new employer. Patient did say that she starts this new job on  Monday and wanted to know if a physical could be done before Monday. I did let patient know for the TB test that it could not be done this week because we have to give the results within a certain amount of days. Please advise. Patient phone number is [phone]

## 2018-08-09 ENCOUNTER — Encounter: Payer: Self-pay | Admitting: Family Medicine

## 2018-08-09 ENCOUNTER — Ambulatory Visit (INDEPENDENT_AMBULATORY_CARE_PROVIDER_SITE_OTHER): Payer: BC Managed Care – PPO | Admitting: Family Medicine

## 2018-08-09 ENCOUNTER — Other Ambulatory Visit: Payer: Self-pay

## 2018-08-09 VITALS — BP 120/60 | HR 75 | Temp 99.0°F | Ht 65.0 in | Wt 142.0 lb

## 2018-08-09 DIAGNOSIS — Z0000 Encounter for general adult medical examination without abnormal findings: Secondary | ICD-10-CM

## 2018-08-09 DIAGNOSIS — Z111 Encounter for screening for respiratory tuberculosis: Secondary | ICD-10-CM

## 2018-08-09 NOTE — Patient Instructions (Addendum)
Health Maintenance Due  Topic Date Due  . HIV Screening  06/19/2004  . TETANUS/TDAP  06/19/2008  . PAP-Cervical Cytology Screening  06/20/2010  . INFLUENZA VACCINE  08/05/2018  . PAP SMEAR-Modifier  08/25/2018    Depression screen [facility] 2/9 10/08/2016 05/14/2016 12/03/2015  Decreased Interest 0 0 0  Down, Depressed, Hopeless 0 0 0  PHQ - 2 Score 0 0 0   Preventive Care 31-29 Years Old, Female Preventive care refers to visits with your health care provider and lifestyle choices that can promote health and wellness. This includes:  A yearly physical exam. This may also be called an annual well check.  Regular dental visits and eye exams.  Immunizations.  Screening for certain conditions.  Healthy lifestyle choices, such as eating a healthy diet, getting regular exercise, not using drugs or products that contain nicotine and tobacco, and limiting alcohol use. What can I expect for my preventive care visit? Physical exam Your health care provider will check your:  Height and weight. This may be used to calculate body mass index (BMI), which tells if you are at a healthy weight.  Heart rate and blood pressure.  Skin for abnormal spots. Counseling Your health care provider may ask you questions about your:  Alcohol, tobacco, and drug use.  Emotional well-being.  Home and relationship well-being.  Sexual activity.  Eating habits.  Work and work Statistician.  Method of birth control.  Menstrual cycle.  Pregnancy history. What immunizations do I need?  Influenza (flu) vaccine  This is recommended every year. Tetanus, diphtheria, and pertussis (Tdap) vaccine  You may need a Td booster every 10 years. Varicella (chickenpox) vaccine  You may need this if you have not been vaccinated. Human papillomavirus (HPV) vaccine  If recommended by your health care provider, you may need three doses over 6 months. Measles, mumps, and rubella (MMR) vaccine  You may need at  least one dose of MMR. You may also need a second dose. Meningococcal conjugate (MenACWY) vaccine  One dose is recommended if you are age 61-21 years and a first-year college student living in a residence hall, or if you have one of several medical conditions. You may also need additional booster doses. Pneumococcal conjugate (PCV13) vaccine  You may need this if you have certain conditions and were not previously vaccinated. Pneumococcal polysaccharide (PPSV23) vaccine  You may need one or two doses if you smoke cigarettes or if you have certain conditions. Hepatitis A vaccine  You may need this if you have certain conditions or if you travel or work in places where you may be exposed to hepatitis A. Hepatitis B vaccine  You may need this if you have certain conditions or if you travel or work in places where you may be exposed to hepatitis B. Haemophilus influenzae type b (Hib) vaccine  You may need this if you have certain conditions. You may receive vaccines as individual doses or as more than one vaccine together in one shot (combination vaccines). Talk with your health care provider about the risks and benefits of combination vaccines. What tests do I need?  Blood tests  Lipid and cholesterol levels. These may be checked every 5 years starting at age 38.  Hepatitis C test.  Hepatitis B test. Screening  Diabetes screening. This is done by checking your blood sugar (glucose) after you have not eaten for a while (fasting).  Sexually transmitted disease (STD) testing.  BRCA-related cancer screening. This may be done if you have  a family history of breast, ovarian, tubal, or peritoneal cancers.  Pelvic exam and Pap test. This may be done every 3 years starting at age 54. Starting at age 29, this may be done every 5 years if you have a Pap test in combination with an HPV test. Talk with your health care provider about your test results, treatment options, and if necessary, the  need for more tests. Follow these instructions at home: Eating and drinking   Eat a diet that includes fresh fruits and vegetables, whole grains, lean protein, and low-fat dairy.  Take vitamin and mineral supplements as recommended by your health care provider.  Do not drink alcohol if: ? Your health care provider tells you not to drink. ? You are pregnant, may be pregnant, or are planning to become pregnant.  If you drink alcohol: ? Limit how much you have to 0-1 drink a day. ? Be aware of how much alcohol is in your drink. In the [place] one drink equals one 12 oz bottle of beer (355 mL), one 5 oz glass of wine (148 mL), or one 1 oz glass of hard liquor (44 mL). Lifestyle  Take daily care of your teeth and gums.  Stay active. Exercise for at least 30 minutes on 5 or more days each week.  Do not use any products that contain nicotine or tobacco, such as cigarettes, e-cigarettes, and chewing tobacco. If you need help quitting, ask your health care provider.  If you are sexually active, practice safe sex. Use a condom or other form of birth control (contraception) in order to prevent pregnancy and STIs (sexually transmitted infections). If you plan to become pregnant, see your health care provider for a preconception visit. What's next?  Visit your health care provider once a year for a well check visit.  Ask your health care provider how often you should have your eyes and teeth checked.  Stay up to date on all vaccines. This information is not intended to replace advice given to you by your health care provider. Make sure you discuss any questions you have with your health care provider. Document Released: 02/16/2001 Document Revised: 09/01/2017 Document Reviewed: 09/01/2017 Elsevier Patient Education  2020 [employer].

## 2018-08-09 NOTE — Progress Notes (Signed)
Subjective:     Alexa Hicks is a 28 y.o. female and is here for a comprehensive physical exam. The patient reports no problems.   Pt had her nexplanon removed in Jan 2020.  Also seen by OB/Gyn.  Pap done 08/24/2017.  Social Hx:  Pt had to move her April 2020 wedding day 2/2 COVID-19 pandemic.  Pt was able to have a smaller ceremony on July 11 with ~20 family and friends in attendance.  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  Pt's daughter is doing well, but wants to go back to school.  Social History   Socioeconomic History  . Marital status: Single    Spouse name: Not on file  . Number of children: Not on file  . Years of education: Not on file  . Highest education level: Not on file  Occupational History  . Not on file  Social Needs  . Financial resource strain: Not on file  . Food insecurity    Worry: Not on file    Inability: Not on file  . Transportation needs    Medical: Not on file    Non-medical: Not on file  Tobacco Use  . Smoking status: Never Smoker  . Smokeless tobacco: Never Used  Substance and Sexual Activity  . Alcohol use: Yes    Alcohol/week: 2.0 standard drinks    Types: 2 Standard drinks or equivalent per week  . Drug use: No  . Sexual activity: Yes  Lifestyle  . Physical activity    Days per week: Not on file    Minutes per session: Not on file  . Stress: Not on file  Relationships  . Social Herbalist on phone: Not on file    Gets together: Not on file    Attends religious service: Not on file    Active member of club or organization: Not on file    Attends meetings of clubs or organizations: Not on file    Relationship status: Not on file  . Intimate partner violence    Fear of current or ex partner: Not on file    Emotionally abused: Not on file    Physically abused: Not on file    Forced sexual activity: Not on file  Other Topics Concern  . Not on file   Social History Narrative  . Not on file   Health Maintenance  Topic Date Due  . HIV Screening  06/19/2004  . TETANUS/TDAP  06/19/2008  . PAP-Cervical Cytology Screening  06/20/2010  . INFLUENZA VACCINE  08/05/2018  . PAP SMEAR-Modifier  08/25/2018    The following portions of the patient's history were reviewed and updated as appropriate: allergies, current medications, past family history, past medical history, past social history, past surgical history and problem list.  Review of Systems Pertinent items noted in HPI and remainder of comprehensive ROS otherwise negative.   Objective:    BP 120/60   Pulse 75   Temp 99 F (37.2 C) (Oral)   Ht 5\' 5"  (1.651 m)   Wt 142 lb (64.4 kg)   LMP 07/23/2018   SpO2 99%   BMI 23.63 kg/m  General appearance: alert, cooperative and no distress Head: Normocephalic, without obvious abnormality, atraumatic Eyes: conjunctivae/corneas clear. PERRL, EOM's intact. Fundi benign. Ears: normal TM's and external ear canals both ears Nose: Nares normal. Septum midline. Mucosa normal. No drainage or sinus tenderness.  Throat: lips, mucosa, and tongue normal; teeth and gums normal Neck: no adenopathy, no carotid bruit, no JVD, supple, symmetrical, trachea midline and thyroid not enlarged, symmetric, no tenderness/mass/nodules Lungs: clear to auscultation bilaterally Heart: regular rate and rhythm, S1, S2 normal, no murmur, click, rub or gallop Abdomen: soft, non-tender; bowel sounds normal; no masses,  no organomegaly Extremities: extremities normal, atraumatic, no cyanosis or edema Pulses: 2+ and symmetric Skin: Skin color, texture, turgor normal. No rashes or lesions Lymph nodes: Cervical, supraclavicular, and axillary nodes normal. Neurologic: Alert and oriented X 3, normal strength and tone. Normal symmetric reflexes. Normal coordination and gait    Assessment:    Healthy female exam.      Plan:     Anticipatory guidance given including  wearing seatbelts, smoke detectors in the home, increasing physical activity, increasing p.o. intake of water and vegetables. -pt declines labs at this time -pap up to date, followed by OB/Gyn -given handout See After Visit Summary for Counseling Recommendations   -next CPE in 1 yr  Need for TB test -tb skin test placed.  Pt to return on Friday 08/11/18 for reading. -form for pt's employer completed and in providers completed work folder for pick up on Friday.  F/u prn  Abbe AmsterdamShannon Ashyla Luth, MD

## 2018-08-11 LAB — TB SKIN TEST
Induration: 0 mm
TB Skin Test: NEGATIVE

## 2018-10-02 ENCOUNTER — Other Ambulatory Visit: Payer: Self-pay

## 2018-10-02 DIAGNOSIS — Z20822 Contact with and (suspected) exposure to covid-19: Secondary | ICD-10-CM

## 2018-10-03 LAB — NOVEL CORONAVIRUS, NAA: SARS-CoV-2, NAA: NOT DETECTED

## 2018-12-14 ENCOUNTER — Other Ambulatory Visit: Payer: Self-pay

## 2018-12-14 DIAGNOSIS — Z20822 Contact with and (suspected) exposure to covid-19: Secondary | ICD-10-CM

## 2018-12-17 LAB — NOVEL CORONAVIRUS, NAA: SARS-CoV-2, NAA: NOT DETECTED

## 2019-01-09 ENCOUNTER — Ambulatory Visit: Payer: Self-pay | Attending: Internal Medicine

## 2019-01-09 DIAGNOSIS — Z20822 Contact with and (suspected) exposure to covid-19: Secondary | ICD-10-CM | POA: Insufficient documentation

## 2019-01-11 LAB — NOVEL CORONAVIRUS, NAA: SARS-CoV-2, NAA: NOT DETECTED

## 2019-08-27 ENCOUNTER — Encounter: Payer: Self-pay | Admitting: Emergency Medicine

## 2019-08-27 ENCOUNTER — Other Ambulatory Visit: Payer: Self-pay

## 2019-08-27 DIAGNOSIS — E876 Hypokalemia: Secondary | ICD-10-CM | POA: Diagnosis present

## 2019-08-27 DIAGNOSIS — N730 Acute parametritis and pelvic cellulitis: Principal | ICD-10-CM | POA: Diagnosis present

## 2019-08-27 DIAGNOSIS — Z20822 Contact with and (suspected) exposure to covid-19: Secondary | ICD-10-CM | POA: Diagnosis present

## 2019-08-27 DIAGNOSIS — N7091 Salpingitis, unspecified: Secondary | ICD-10-CM | POA: Diagnosis present

## 2019-08-27 DIAGNOSIS — R102 Pelvic and perineal pain: Secondary | ICD-10-CM | POA: Diagnosis not present

## 2019-08-27 LAB — COMPREHENSIVE METABOLIC PANEL
ALT: 12 U/L (ref 0–44)
AST: 17 U/L (ref 15–41)
Albumin: 3.9 g/dL (ref 3.5–5.0)
Alkaline Phosphatase: 58 U/L (ref 38–126)
Anion gap: 13 (ref 5–15)
BUN: 9 mg/dL (ref 6–20)
CO2: 27 mmol/L (ref 22–32)
Calcium: 8.9 mg/dL (ref 8.9–10.3)
Chloride: 96 mmol/L — ABNORMAL LOW (ref 98–111)
Creatinine, Ser: 0.83 mg/dL (ref 0.44–1.00)
GFR calc Af Amer: >60 mL/min (ref >60)
GFR calc non Af Amer: >60 mL/min (ref >60)
Glucose, Bld: 131 mg/dL — ABNORMAL HIGH (ref 70–99)
Potassium: 3 mmol/L — ABNORMAL LOW (ref 3.5–5.1)
Sodium: 136 mmol/L (ref 135–145)
Total Bilirubin: 0.9 mg/dL (ref 0.3–1.2)
Total Protein: 7.8 g/dL (ref 6.5–8.1)

## 2019-08-27 LAB — URINALYSIS, COMPLETE (UACMP) WITH MICROSCOPIC
Bacteria, UA: NONE SEEN
Bilirubin Urine: NEGATIVE
Glucose, UA: NEGATIVE mg/dL
Ketones, ur: 20 mg/dL — AB
Leukocytes,Ua: NEGATIVE
Nitrite: NEGATIVE
Protein, ur: NEGATIVE mg/dL
Specific Gravity, Urine: 1.01 (ref 1.005–1.030)
pH: 6 (ref 5.0–8.0)

## 2019-08-27 LAB — CBC
HCT: 35.5 % — ABNORMAL LOW (ref 36.0–46.0)
Hemoglobin: 11.2 g/dL — ABNORMAL LOW (ref 12.0–15.0)
MCH: 26.7 pg (ref 26.0–34.0)
MCHC: 31.5 g/dL (ref 30.0–36.0)
MCV: 84.5 fL (ref 80.0–100.0)
Platelets: 258 10*3/uL (ref 150–400)
RBC: 4.2 MIL/uL (ref 3.87–5.11)
RDW: 14.4 % (ref 11.5–15.5)
WBC: 21.8 10*3/uL — ABNORMAL HIGH (ref 4.0–10.5)
nRBC: 0 % (ref 0.0–0.2)

## 2019-08-27 LAB — POCT PREGNANCY, URINE: Preg Test, Ur: NEGATIVE

## 2019-08-27 LAB — LIPASE, BLOOD: Lipase: 17 U/L (ref 11–51)

## 2019-08-27 NOTE — ED Triage Notes (Signed)
Patient presents to the ED with right lower quadrant pain that started yesterday at 3am.  Patient was seen at [facility] in [place] yesterday and was given antibiotics and told she had an ovarian cyst.  Patient has RLQ tenderness and fever at this time.  Patient denies vomiting and diarrhea.  Patient took Tylenol #3 at 10:45pm.

## 2019-08-28 ENCOUNTER — Inpatient Hospital Stay: Payer: 59

## 2019-08-28 ENCOUNTER — Inpatient Hospital Stay
Admission: EM | Admit: 2019-08-28 | Discharge: 2019-08-30 | DRG: 759 | Disposition: A | Discharge status: Home / Self Care | Payer: 59 | Attending: Obstetrics and Gynecology | Admitting: Obstetrics and Gynecology

## 2019-08-28 ENCOUNTER — Encounter: Payer: Self-pay | Admitting: Obstetrics and Gynecology

## 2019-08-28 DIAGNOSIS — E876 Hypokalemia: Secondary | ICD-10-CM | POA: Diagnosis present

## 2019-08-28 DIAGNOSIS — N739 Female pelvic inflammatory disease, unspecified: Secondary | ICD-10-CM | POA: Diagnosis present

## 2019-08-28 DIAGNOSIS — R102 Pelvic and perineal pain: Secondary | ICD-10-CM | POA: Diagnosis present

## 2019-08-28 DIAGNOSIS — N7091 Salpingitis, unspecified: Secondary | ICD-10-CM | POA: Diagnosis present

## 2019-08-28 DIAGNOSIS — N730 Acute parametritis and pelvic cellulitis: Secondary | ICD-10-CM

## 2019-08-28 DIAGNOSIS — Z20822 Contact with and (suspected) exposure to covid-19: Secondary | ICD-10-CM | POA: Diagnosis present

## 2019-08-28 LAB — URINE DRUG SCREEN, QUALITATIVE (ARMC ONLY)
Amphetamines, Ur Screen: NOT DETECTED
Amphetamines, Ur Screen: NOT DETECTED
Barbiturates, Ur Screen: NOT DETECTED
Barbiturates, Ur Screen: NOT DETECTED
Benzodiazepine, Ur Scrn: NOT DETECTED
Benzodiazepine, Ur Scrn: NOT DETECTED
Cannabinoid 50 Ng, Ur ~~LOC~~: NOT DETECTED
Cannabinoid 50 Ng, Ur ~~LOC~~: NOT DETECTED
Cocaine Metabolite,Ur ~~LOC~~: NOT DETECTED
Cocaine Metabolite,Ur ~~LOC~~: NOT DETECTED
MDMA (Ecstasy)Ur Screen: NOT DETECTED
MDMA (Ecstasy)Ur Screen: NOT DETECTED
Methadone Scn, Ur: NOT DETECTED
Methadone Scn, Ur: NOT DETECTED
Opiate, Ur Screen: POSITIVE — AB
Opiate, Ur Screen: POSITIVE — AB
Phencyclidine (PCP) Ur S: NOT DETECTED
Phencyclidine (PCP) Ur S: NOT DETECTED
Tricyclic, Ur Screen: NOT DETECTED
Tricyclic, Ur Screen: NOT DETECTED

## 2019-08-28 LAB — CBC WITH DIFFERENTIAL/PLATELET
Abs Immature Granulocytes: 0.13 10*3/uL — ABNORMAL HIGH (ref 0.00–0.07)
Basophils Absolute: 0 10*3/uL (ref 0.0–0.1)
Basophils Relative: 0 %
Eosinophils Absolute: 0 10*3/uL (ref 0.0–0.5)
Eosinophils Relative: 0 %
HCT: 31.3 % — ABNORMAL LOW (ref 36.0–46.0)
Hemoglobin: 9.9 g/dL — ABNORMAL LOW (ref 12.0–15.0)
Immature Granulocytes: 1 %
Lymphocytes Relative: 11 %
Lymphs Abs: 1.9 10*3/uL (ref 0.7–4.0)
MCH: 26.5 pg (ref 26.0–34.0)
MCHC: 31.6 g/dL (ref 30.0–36.0)
MCV: 83.7 fL (ref 80.0–100.0)
Monocytes Absolute: 1.4 10*3/uL — ABNORMAL HIGH (ref 0.1–1.0)
Monocytes Relative: 8 %
Neutro Abs: 14.3 10*3/uL — ABNORMAL HIGH (ref 1.7–7.7)
Neutrophils Relative %: 80 %
Platelets: 229 10*3/uL (ref 150–400)
RBC: 3.74 MIL/uL — ABNORMAL LOW (ref 3.87–5.11)
RDW: 14.4 % (ref 11.5–15.5)
WBC: 17.7 10*3/uL — ABNORMAL HIGH (ref 4.0–10.5)
nRBC: 0 % (ref 0.0–0.2)

## 2019-08-28 LAB — URINALYSIS, ROUTINE W REFLEX MICROSCOPIC
Bilirubin Urine: NEGATIVE
Glucose, UA: NEGATIVE mg/dL
Hgb urine dipstick: NEGATIVE
Ketones, ur: 5 mg/dL — AB
Leukocytes,Ua: NEGATIVE
Nitrite: NEGATIVE
Protein, ur: NEGATIVE mg/dL
Specific Gravity, Urine: 1.002 — ABNORMAL LOW (ref 1.005–1.030)
pH: 6 (ref 5.0–8.0)

## 2019-08-28 LAB — BASIC METABOLIC PANEL
Anion gap: 11 (ref 5–15)
BUN: 8 mg/dL (ref 6–20)
CO2: 25 mmol/L (ref 22–32)
Calcium: 8.6 mg/dL — ABNORMAL LOW (ref 8.9–10.3)
Chloride: 101 mmol/L (ref 98–111)
Creatinine, Ser: 0.76 mg/dL (ref 0.44–1.00)
GFR calc Af Amer: >60 mL/min (ref >60)
GFR calc non Af Amer: >60 mL/min (ref >60)
Glucose, Bld: 112 mg/dL — ABNORMAL HIGH (ref 70–99)
Potassium: 3.7 mmol/L (ref 3.5–5.1)
Sodium: 137 mmol/L (ref 135–145)

## 2019-08-28 LAB — PROTIME-INR
INR: 1.3 — ABNORMAL HIGH (ref 0.8–1.2)
Prothrombin Time: 15.3 seconds — ABNORMAL HIGH (ref 11.4–15.2)

## 2019-08-28 LAB — URINE CULTURE: Culture: NO GROWTH

## 2019-08-28 LAB — CHLAMYDIA/NGC RT PCR (ARMC ONLY)
Chlamydia Tr: NOT DETECTED
N gonorrhoeae: NOT DETECTED

## 2019-08-28 LAB — SARS CORONAVIRUS 2 BY RT PCR (HOSPITAL ORDER, PERFORMED IN ~~LOC~~ HOSPITAL LAB): SARS Coronavirus 2: NEGATIVE

## 2019-08-28 LAB — APTT: aPTT: 38 seconds — ABNORMAL HIGH (ref 24–36)

## 2019-08-28 LAB — RAPID HIV SCREEN (HIV 1/2 AB+AG)
HIV 1/2 Antibodies: NONREACTIVE
HIV-1 P24 Antigen - HIV24: NONREACTIVE

## 2019-08-28 LAB — LACTIC ACID, PLASMA: Lactic Acid, Venous: 1 mmol/L (ref 0.5–1.9)

## 2019-08-28 MED ORDER — ACETAMINOPHEN 325 MG PO TABS
650.0000 mg | ORAL_TABLET | Freq: Four times a day (QID) | ORAL | Status: DC | PRN
  Administered 2019-08-28 – 2019-08-29 (×3): 650 mg via ORAL
  Filled 2019-08-28 (×3): qty 2

## 2019-08-28 MED ORDER — SODIUM CHLORIDE 0.9 % IV SOLN
100.0000 mg | Freq: Two times a day (BID) | INTRAVENOUS | Status: DC
  Administered 2019-08-28: 100 mg via INTRAVENOUS
  Filled 2019-08-28 (×3): qty 100

## 2019-08-28 MED ORDER — LACTATED RINGERS IV SOLN: INTRAVENOUS | Status: DC

## 2019-08-28 MED ORDER — SODIUM CHLORIDE 0.9 % IV SOLN
100.0000 mg | Freq: Once | INTRAVENOUS | Status: AC
  Administered 2019-08-28: 100 mg via INTRAVENOUS
  Filled 2019-08-28: qty 100

## 2019-08-28 MED ORDER — BISACODYL 5 MG PO TBEC
5.0000 mg | DELAYED_RELEASE_TABLET | Freq: Every day | ORAL | Status: DC | PRN
  Filled 2019-08-28: qty 1

## 2019-08-28 MED ORDER — ONDANSETRON HCL 4 MG PO TABS: 4.0000 mg | ORAL_TABLET | Freq: Four times a day (QID) | ORAL | Status: DC | PRN

## 2019-08-28 MED ORDER — MAGNESIUM CITRATE PO SOLN
1.0000 | Freq: Once | ORAL | Status: DC | PRN
  Filled 2019-08-28: qty 296

## 2019-08-28 MED ORDER — OXYCODONE-ACETAMINOPHEN 5-325 MG PO TABS: 1.0000 | ORAL_TABLET | ORAL | Status: DC | PRN

## 2019-08-28 MED ORDER — DOCUSATE SODIUM 100 MG PO CAPS
100.0000 mg | ORAL_CAPSULE | Freq: Two times a day (BID) | ORAL | Status: DC
  Administered 2019-08-28 – 2019-08-29 (×3): 100 mg via ORAL
  Filled 2019-08-28 (×4): qty 1

## 2019-08-28 MED ORDER — ONDANSETRON HCL 4 MG/2ML IJ SOLN: 4.0000 mg | Freq: Four times a day (QID) | INTRAMUSCULAR | Status: DC | PRN

## 2019-08-28 MED ORDER — SODIUM CHLORIDE 0.9 % IV SOLN
2.0000 g | Freq: Once | INTRAVENOUS | Status: DC
  Filled 2019-08-28: qty 2

## 2019-08-28 MED ORDER — HYDROMORPHONE HCL 1 MG/ML IJ SOLN: 0.2000 mg | INTRAMUSCULAR | Status: DC | PRN

## 2019-08-28 MED ORDER — MAGNESIUM HYDROXIDE 400 MG/5ML PO SUSP: 30.0000 mL | Freq: Every day | ORAL | Status: DC | PRN

## 2019-08-28 MED ORDER — IBUPROFEN 600 MG PO TABS
600.0000 mg | ORAL_TABLET | Freq: Four times a day (QID) | ORAL | Status: DC | PRN
  Administered 2019-08-28 – 2019-08-29 (×4): 600 mg via ORAL
  Filled 2019-08-28 (×4): qty 1

## 2019-08-28 MED ORDER — SODIUM CHLORIDE 0.9 % IV SOLN
2.0000 g | Freq: Four times a day (QID) | INTRAVENOUS | Status: DC
  Administered 2019-08-28 – 2019-08-29 (×5): 2 g via INTRAVENOUS
  Filled 2019-08-28 (×6): qty 2

## 2019-08-28 MED ORDER — ZOLPIDEM TARTRATE 5 MG PO TABS: 5.0000 mg | ORAL_TABLET | Freq: Every evening | ORAL | Status: DC | PRN

## 2019-08-28 MED ORDER — CEFOXITIN SODIUM 1 G IV SOLR: 2.0000 g | Freq: Once | INTRAVENOUS | Status: DC

## 2019-08-28 MED ORDER — ALUM & MAG HYDROXIDE-SIMETH 200-200-20 MG/5ML PO SUSP: 30.0000 mL | ORAL | Status: DC | PRN

## 2019-08-28 NOTE — Discharge Instructions (Signed)
Pelvic Inflammatory Disease  Pelvic inflammatory disease (PID) is an infection in some or all of the female reproductive organs. PID can be in the womb (uterus), ovaries, fallopian tubes, or nearby tissues that are inside the lower belly area (pelvis). PID can lead to problems if it is not treated. What are the causes?  Germs (bacteria) that are spread during sex. This is the most common cause.  Germs in the vagina that are not spread during sex.  Germs that travel up from the vagina or cervix to the reproductive organs after: ? The birth of a baby. ? A miscarriage. ? An abortion. ? Pelvic surgery. ? Insertion of an intrauterine device (IUD). ? A sexual assault. What increases the risk?  Being younger than 30 years old.  Having sex at a young age.  Having a history of STI (sexually transmitted infection) or PID.  Not using barrier birth control, such as condoms.  Having a lot of sex partners.  Having sex with someone who has symptoms of an STI.  Using a douche.  Having an IUD put in place. What are the signs or symptoms?  Pain in the belly area.  Fever.  Chills.  Discharge from the vagina that is not normal.  Bleeding from the womb that is not normal.  Pain soon after the end of a menstrual period.  Pain when you pee (urinate).  Pain with sex.  Feeling sick to your stomach (nauseous) or throwing up (vomiting). How is this treated?  Antibiotic medicines. In very bad cases, these may be given through an IV tube.  Surgery. This is rare.  Efforts to stop the spread of the infection. Sex partners may need to be treated. It may take weeks until you feel all better. Your doctor may test you for infection again after you finish treatment. You should also be checked for HIV (human immunodeficiency virus). Follow these instructions at home:  Take over-the-counter and prescription medicines only as told by your doctor.  If you were prescribed an antibiotic  medicine, take it as told by your doctor. Do not stop taking it even if you start to feel better.  Do not have sex until treatment is done or as told by your doctor.  Tell your sex partner if you have PID. Your partner may need to be treated.  Keep all follow-up visits as told by your doctor. This is important. Contact a doctor if:  You have more fluid or fluid that is not normal coming from your vagina.  Your pain does not improve.  You throw up.  You have a fever.  You cannot take your medicines.  Your partner has an STI.  You have pain when you pee. Get help right away if:  You have more pain in the belly area.  You have chills.  You are not better in 72 hours with treatment. Summary  Pelvic inflammatory disease (PID) is caused by an infection in some or all of the female reproductive organs.  PID is a serious infection.  This infection is most often treated with antibiotics.  Do not have sex until treatment is done or as told by your doctor. This information is not intended to replace advice given to you by your health care provider. Make sure you discuss any questions you have with your health care provider. Document Revised: 09/08/2017 Document Reviewed: 09/14/2017 Elsevier Patient Education  2020 [employer]  

## 2019-08-28 NOTE — ED Provider Notes (Addendum)
[facility] Emergency Department Provider Note __________   First MD Initiated Contact with Patient 08/28/19 0036     (approximate)  I have reviewed the triage vital signs and the nursing notes.   HISTORY  Chief Complaint Abdominal Pain and Fever    HPI Alexa Hicks is a 29 y.o. female with below list of previous medical conditions including PID presents to the emergency department secondary to worsening right lower quadrant abdominal pain with a temperature of 103.5 at home.  Patient was seen at [facility] yesterday and informed that she had an ovarian cyst.  Patient does however admit that she was given a shot and pills for home and prescribed Tylenol 3 which she states that she has been taking without any improvement.  She states that the pain began on Saturday and progressively worsened since onset.  Patient states that pain was worsened with attempted ambulation.  Review of the patient's chart from [facility] revealed a CT scan that revealed:  IMPRESSION:  1. Dilated tubular structure in the RIGHT adnexa up to 1.9 cm diameter appears to represent the fallopian tube, with adjacent inflammatory stranding and trace free fluid in the cul-de-sac, configuration suggests hydrosalpinx/pyosalpinx potentially related to PID depending on the clinical setting  2. No acute intestinal obstruction or inflammatory change. Specimen Collected: 08/26/19 12:25 PM Last Resulted: 08/26/19 12:34 PM  Received From: [facility]  Result Received: 08/27/19 10:36 PM       Past Medical History:  Diagnosis Date  . Frequent headaches   . Hay fever   . Hypertension     Patient Active Problem List   Diagnosis Date Noted  . H/O seasonal allergies 09/14/2017  . Nexplanon in place 09/14/2017    History reviewed. No pertinent surgical history.  Prior to Admission medications   Not on File    Allergies Patient has no known  allergies.  Family History  Problem Relation Age of Onset  . Cancer Maternal Grandfather   . Early death Maternal Grandfather   . Arthritis Maternal Grandmother   . Diabetes Maternal Grandmother   . Stroke Maternal Grandmother   . Early death Paternal Grandmother   . Early death Paternal Grandfather     Social History Social History   Tobacco Use  . Smoking status: Never Smoker  . Smokeless tobacco: Never Used  Substance Use Topics  . Alcohol use: Yes    Alcohol/week: 2.0 standard drinks    Types: 2 Standard drinks or equivalent per week  . Drug use: No    Review of Systems Constitutional: Positive for fever/chills Eyes: No visual changes. ENT: No sore throat. Cardiovascular: Denies chest pain. Respiratory: Denies shortness of breath. Gastrointestinal: Positive for right lower quadrant pain.  No nausea, no vomiting.  No diarrhea.  No constipation. Genitourinary: Negative for dysuria. Musculoskeletal: Negative for neck pain.  Negative for back pain. Integumentary: Negative for rash. Neurological: Negative for headaches, focal weakness or numbness.   ____________________________________________   PHYSICAL EXAM:  VITAL SIGNS: ED Triage Vitals  Enc Vitals Group     BP 08/27/19 2242 120/70     Pulse Rate 08/27/19 2242 (!) 117     Resp 08/27/19 2242 18     Temp 08/27/19 2242 (!) 100.7 F (38.2 C)     Temp Source 08/27/19 2242 Oral     SpO2 08/27/19 2242 96 %     Weight 08/27/19 2304 65.8 kg (145 lb)     Height  08/27/19 2304 1.676 m (5\' 6" )     Head Circumference --      Peak Flow --      Pain Score 08/27/19 2303 9     Pain Loc --      Pain Edu? --      Excl. in GC? --     Constitutional: Alert and oriented.  Apparent discomfort Eyes: Conjunctivae are normal.  Head: Atraumatic. Mouth/Throat: Patient is wearing a mask. Neck: No stridor.  No meningeal signs.   Cardiovascular: Normal rate, regular rhythm. Good peripheral circulation. Grossly normal heart  sounds. Respiratory: Normal respiratory effort.  No retractions. Gastrointestinal: RLQ pain. No distention.  Musculoskeletal: No lower extremity tenderness nor edema. No gross deformities of extremities. Neurologic:  Normal speech and language. No gross focal neurologic deficits are appreciated.  Skin:  Skin is warm, dry and intact. Psychiatric: Mood and affect are normal. Speech and behavior are normal.  ____________________________________________   LABS (all labs ordered are listed, but only abnormal results are displayed)  Labs Reviewed  COMPREHENSIVE METABOLIC PANEL - Abnormal; Notable for the following components:      Result Value   Potassium 3.0 (*)    Chloride 96 (*)    Glucose, Bld 131 (*)    All other components within normal limits  CBC - Abnormal; Notable for the following components:   WBC 21.8 (*)    Hemoglobin 11.2 (*)    HCT 35.5 (*)    All other components within normal limits  URINALYSIS, COMPLETE (UACMP) WITH MICROSCOPIC - Abnormal; Notable for the following components:   Color, Urine YELLOW (*)    APPearance CLEAR (*)    Hgb urine dipstick MODERATE (*)    Ketones, ur 20 (*)    All other components within normal limits  SARS CORONAVIRUS 2 BY RT PCR (HOSPITAL ORDER, PERFORMED IN [facility])  LIPASE, BLOOD  POC URINE PREG, ED  POCT PREGNANCY, URINE   __  Procedures   ____________________________________________   INITIAL IMPRESSION / MDM / ASSESSMENT AND PLAN / ED COURSE  As part of my medical decision making, I reviewed the following data within the electronic MEDICAL RECORD NUMBER  29 year old female presented with above-stated history and physical exam consistent with PID as depicted on the patient's CT from [facility].  Patient meets sepsis criteria given the fact she is febrile tachycardic leukocytosis with source of infectious etiology.  As such patient given IV cefoxitin 2 g and doxycycline 100 mg.  Patient subsequently discussed  with Dr. Willcox OB/GYN on-call who admitted the patient for further management.  ____________________________________________  FINAL CLINICAL IMPRESSION(S) / ED DIAGNOSES  Final diagnoses:  PID (acute pelvic inflammatory disease)     MEDICATIONS GIVEN DURING THIS VISIT:  Medications  cefOXitin (MEFOXIN) injection 2 g (has no administration in time range)  doxycycline (VIBRAMYCIN) 100 mg in sodium chloride 0.9 % 250 mL IVPB (has no administration in time range)     ED Discharge Orders    None      *Please note:  Alexa Hicks was evaluated in Emergency Department on 08/28/2019 for the symptoms described in the history of present illness. She was evaluated in the context of the global COVID-19 pandemic, which necessitated consideration that the patient might be at risk for infection with the SARS-CoV-2 virus that causes COVID-19. Institutional protocols and algorithms that pertain to the evaluation of patients at risk for COVID-19 are in a state of rapid change based on information released  by regulatory bodies including the CDC and federal and state organizations. These policies and algorithms were followed during the patient's care in the ED.  Some ED evaluations and interventions may be delayed as a result of limited staffing during and after the pandemic.*  Note:  This document was prepared using Dragon voice recognition software and may include unintentional dictation errors.   Darci Current, MD 08/28/19 0134    Darci Current, MD 08/28/19 [phone]

## 2019-08-28 NOTE — Progress Notes (Signed)
CODE SEPSIS - PHARMACY COMMUNICATION  **Broad Spectrum Antibiotics should be administered within 1 hour of Sepsis diagnosis**  Time Code Sepsis Called/Page Received: 0132  Antibiotics Ordered: doxy/cefoxitin  Time of 1st antibiotic administration: 0131  Additional action taken by pharmacy:   If necessary, Name of Provider/Nurse Contacted:     Thomasene Ripple ,PharmD Clinical Pharmacist  08/28/2019  3:24 AM

## 2019-08-28 NOTE — H&P (Signed)
Consult History and Physical   SERVICE: Gynecology   Patient Name: Alexa Hicks [facility] Patient MRN:   [id number]  CC: RLQ pelvic pain  HPI: DONABELLE MOLDEN is a 29 y.o. G0 with pelvic pain, fever.  - seen on 8/22 at [facility] and given IM and PO abx, dc home with PO Doxycycline and pain medication. Told she had an ovarian cyst.  - worsening pain and fever 103 at home on 08/27/19   Review of Systems: positives in bold GEN:   fevers, chills, weight changes, appetite changes, fatigue, night sweats HEENT:  HA, vision changes, hearing loss, congestion, rhinorrhea, sinus pressure, dysphagia CV:   CP, palpitations PULM:  SOB, cough GI:  abd pain, N/V/D/C GU:  dysuria, urgency, frequency MSK:  arthralgias, myalgias, back pain, swelling SKIN:  rashes, color changes, pallor NEURO:  numbness, weakness, tingling, seizures, dizziness, tremors PSYCH:  depression, anxiety, behavioral problems, confusion  HEME/LYMPH:  easy bruising or bleeding ENDO:  heat/cold intolerance  Past Obstetrical History: OB History   No obstetric history on file.     Past Gynecologic History: No LMP recorded. Monthly menses, is actively trying for pregnancy, Nexplanon removed 01/2018 per pt.   Past Medical History: Past Medical History:  Diagnosis Date  . Frequent headaches   . Hay fever   . Hypertension     Past Surgical History:  History reviewed. No pertinent surgical history.  Family History:  family history includes Arthritis in her maternal grandmother; Cancer in her maternal grandfather; Diabetes in her maternal grandmother; Early death in her maternal grandfather, paternal grandfather, and paternal grandmother; Stroke in her maternal grandmother.  Social History:  Social History   Occupational History  . Not on file  Tobacco Use  . Smoking status: Never Smoker  . Smokeless tobacco: Never Used  Substance and Sexual Activity  . Alcohol use: Yes    Alcohol/week: 2.0  standard drinks    Types: 2 Standard drinks or equivalent per week  . Drug use: No  . Sexual activity: Yes    Home Medications:  Medications reconciled in EPIC  No current facility-administered medications on file prior to encounter.   No current outpatient medications on file prior to encounter.    Allergies:  No Known Allergies  Physical Exam:  Temp:  [97.7 F (36.5 C)-100.7 F (38.2 C)] 97.7 F (36.5 C) (08/24 0846) Pulse Rate:  [80-117] 80 (08/24 0846) Resp:  [16-20] 20 (08/24 0846) BP: (108-123)/(70-90) 108/72 (08/24 0846) SpO2:  [96 %-100 %] 100 % (08/24 0846) Weight:  [65.8 kg] 65.8 kg (08/23 2304)   General Appearance:  Well developed, well nourished, no acute distress, alert and oriented x3 HEENT:  Normocephalic atraumatic, extraocular movements intact, moist mucous membranes Cardiovascular:  Normal S1/S2, regular rate and rhythm, no murmurs Pulmonary:  clear to auscultation, no wheezes, rales or rhonchi, symmetric air entry, good air exchange Abdomen:  Bowel sounds present, soft, nondistended, no abnormal masses, no epigastric pain; bilateral tenderness of lower quadrants to light palpation, right > left.  Extremities:  Full range of motion, no pedal edema, 2+ distal pulses, no tenderness Skin:  normal coloration and turgor, no rashes, no suspicious skin lesions noted  Neurologic:  Cranial nerves 2-12 grossly intact, normal muscle tone, strength 5/5 all four extremities Psychiatric:  Normal mood and affect, appropriate, no AH/VH Pelvic:  deferred   Labs/Studies:   CBC and Coags:  Lab Results  Component Value Date   WBC 17.7 (H) 08/28/2019   NEUTOPHILPCT 80 08/28/2019  EOSPCT 0 08/28/2019   BASOPCT 0 08/28/2019   LYMPHOPCT 11 08/28/2019   HGB 9.9 (L) 08/28/2019   HCT 31.3 (L) 08/28/2019   MCV 83.7 08/28/2019   PLT 229 08/28/2019   INR 1.3 (H) 08/28/2019   CMP:  Lab Results  Component Value Date   NA 136 08/27/2019   K 3.0 (L) 08/27/2019   CL 96  (L) 08/27/2019   CO2 27 08/27/2019   BUN 9 08/27/2019   CREATININE 0.83 08/27/2019   CREATININE 0.70 11/27/2007   PROT 7.8 08/27/2019   BILITOT 0.9 08/27/2019   ALT 12 08/27/2019   AST 17 08/27/2019   ALKPHOS 58 08/27/2019    TVUS:   08/26/19 at [facility]:  IMPRESSION:  1. Probable RIGHT corpus luteal cyst measuring up to 2.8 cm. Simple appearing RIGHT ovarian cyst versus dominant follicle measuring up to 2 cm.   2. Otherwise normal sonographic appearance of the uterus and ovaries with color flow demonstrated.  Other Imaging: 08/26/19 at [facility] CT Abd and Pelvis IMPRESSION:  1. Dilated tubular structure in the RIGHT adnexa up to 1.9 cm diameter appears to represent the fallopian tube, with adjacent inflammatory stranding and trace free fluid in the cul-de-sac, configuration suggests hydrosalpinx/pyosalpinx potentially related to PID depending on the clinical setting  2. No acute intestinal obstruction or inflammatory change.   Assessment / Plan:   ANNMARGARET DECAPRIO is a 29 y.o. who presents with acute pelvic pain accompanied by fever and elevated WBCs  1. PID with hydro/pyosalpinx.  - IV antibiotics: Cefoxitin and Doxycycline to continue for at least 24hrs after last fever.   - Pain mgmt as ordered - activity as tolerated, regular diet.  - improving sx 12hrs after initiation of IV abx, less pain overall and temp max 100 at 0312 8/24.   - WBCs 13.2 (8/22 at outside fac) --> 21.8 --> 17.7; will repeat in Am for downward trend.  - mild hypokalemia noted on CMP done in ER; repeat BMP pending; will give potassium replacement if remains low.  - POC and mgmt per consultation with and directed by Dr Dalbert Garnet, attending Ob/Gyn physician.     Randa Ngo, CNM 08/28/2019 12:14 PM

## 2019-08-29 LAB — CBC WITH DIFFERENTIAL/PLATELET
Abs Immature Granulocytes: 0.06 10*3/uL (ref 0.00–0.07)
Basophils Absolute: 0 10*3/uL (ref 0.0–0.1)
Basophils Relative: 0 %
Eosinophils Absolute: 0.1 10*3/uL (ref 0.0–0.5)
Eosinophils Relative: 1 %
HCT: 29.7 % — ABNORMAL LOW (ref 36.0–46.0)
Hemoglobin: 9.7 g/dL — ABNORMAL LOW (ref 12.0–15.0)
Immature Granulocytes: 1 %
Lymphocytes Relative: 11 %
Lymphs Abs: 1.3 10*3/uL (ref 0.7–4.0)
MCH: 26.9 pg (ref 26.0–34.0)
MCHC: 32.7 g/dL (ref 30.0–36.0)
MCV: 82.3 fL (ref 80.0–100.0)
Monocytes Absolute: 1.1 10*3/uL — ABNORMAL HIGH (ref 0.1–1.0)
Monocytes Relative: 9 %
Neutro Abs: 9.7 10*3/uL — ABNORMAL HIGH (ref 1.7–7.7)
Neutrophils Relative %: 78 %
Platelets: 240 10*3/uL (ref 150–400)
RBC: 3.61 MIL/uL — ABNORMAL LOW (ref 3.87–5.11)
RDW: 14.6 % (ref 11.5–15.5)
WBC: 12.3 10*3/uL — ABNORMAL HIGH (ref 4.0–10.5)
nRBC: 0 % (ref 0.0–0.2)

## 2019-08-29 LAB — RPR: RPR Ser Ql: NONREACTIVE

## 2019-08-29 MED ORDER — SODIUM CHLORIDE 0.9 % IV SOLN
100.0000 mg | Freq: Two times a day (BID) | INTRAVENOUS | Status: AC
  Administered 2019-08-29 (×2): 100 mg via INTRAVENOUS
  Filled 2019-08-29 (×2): qty 100

## 2019-08-29 MED ORDER — SODIUM CHLORIDE 0.9 % IV SOLN
2.0000 g | Freq: Four times a day (QID) | INTRAVENOUS | Status: AC
  Administered 2019-08-29 – 2019-08-30 (×4): 2 g via INTRAVENOUS
  Filled 2019-08-29 (×4): qty 2

## 2019-08-29 NOTE — Progress Notes (Signed)
Obstetric and Gynecology  Subjective  Alexa Hicks is a 29 y.o. female No obstetric history on file. who presented on 08/28/2019 for pelvic pain and fever with leukocytosis.   Objective   Vitals:   08/29/19 0513 08/29/19 0811  BP: 111/77 (!) 101/55  Pulse: 90 85  Resp: 20 18  Temp: 99.5 F (37.5 C) 99.6 F (37.6 C)  SpO2: 100% 100%     Intake/Output Summary (Last 24 hours) at 08/29/2019 0858 Last data filed at 08/29/2019 0831 Gross per 24 hour  Intake 3133.93 ml  Output 1825 ml  Net 1308.93 ml    General: NAD Cardiovascular: RRR, no murmurs Pulmonary: CTAB Abdomen: Benign. Tender to palpation over RLQ, +BS, no guarding. Extremities: No erythema or cords, no calf tenderness, +warmth with normal peripheral pulses.  Labs: Results for orders placed or performed during the hospital encounter of 08/28/19 (from the past 24 hour(s))  CBC WITH DIFFERENTIAL     Status: Abnormal   Collection Time: 08/29/19  6:14 AM  Result Value Ref Range   WBC 12.3 (H) 4.0 - 10.5 K/uL   RBC 3.61 (L) 3.87 - 5.11 MIL/uL   Hemoglobin 9.7 (L) 12.0 - 15.0 g/dL   HCT 63.1 (L) 36 - 46 %   MCV 82.3 80.0 - 100.0 fL   MCH 26.9 26.0 - 34.0 pg   MCHC 32.7 30.0 - 36.0 g/dL   RDW 49.7 02.6 - 37.8 %   Platelets 240 150 - 400 K/uL   nRBC 0.0 0.0 - 0.2 %   Neutrophils Relative % 78 %   Neutro Abs 9.7 (H) 1.7 - 7.7 K/uL   Lymphocytes Relative 11 %   Lymphs Abs 1.3 0.7 - 4.0 K/uL   Monocytes Relative 9 %   Monocytes Absolute 1.1 (H) 0 - 1 K/uL   Eosinophils Relative 1 %   Eosinophils Absolute 0.1 0 - 0 K/uL   Basophils Relative 0 %   Basophils Absolute 0.0 0 - 0 K/uL   Immature Granulocytes 1 %   Abs Immature Granulocytes 0.06 0.00 - 0.07 K/uL    Cultures: Results for orders placed or performed during the hospital encounter of 08/28/19  Urine culture     Status: None   Collection Time: 08/27/19 11:02 PM   Specimen: In/Out Cath Urine  Result Value Ref Range Status   Specimen Description    Final    IN/OUT CATH URINE Performed at [facility], [address], [place], [place] [address]    Special Requests   Final    NONE Performed at [facility], [address], [place], [place] [address]    Culture   Final    NO GROWTH Performed at [facility], [address], [place], [place] [address]    Report Status 08/28/2019 FINAL  Final  SARS Coronavirus 2 by RT PCR (hospital order, performed in [facility] hospital lab) Nasopharyngeal Nasopharyngeal Swab     Status: None   Collection Time: 08/28/19  1:10 AM   Specimen: Nasopharyngeal Swab  Result Value Ref Range Status   SARS Coronavirus 2 NEGATIVE NEGATIVE Final    Comment: (NOTE) SARS-CoV-2 target nucleic acids are NOT DETECTED.  The SARS-CoV-2 RNA is generally detectable in upper and lower respiratory specimens during the acute phase of infection. The lowest concentration of SARS-CoV-2 viral copies this assay can detect is 250 copies / mL. A negative result does not preclude SARS-CoV-2 infection and should not be used as the sole basis  for treatment or other patient management decisions.  A negative result may occur with improper specimen collection / handling, submission of specimen other than nasopharyngeal swab, presence of viral mutation(s) within the areas targeted by this assay, and inadequate number of viral copies (<250 copies / mL). A negative result must be combined with clinical observations, patient history, and epidemiological information.  Fact Sheet for Patients:   BoilerBrush.com.cy  Fact Sheet for Healthcare Providers: https://pope.com/  This test is not yet approved or  cleared by the [place] FDA and has been authorized for detection and/or diagnosis of SARS-CoV-2 by FDA under an Emergency Use Authorization (EUA).  This EUA will remain in effect (meaning this test can be used) for the duration of the COVID-19  declaration under Section 564(b)(1) of the Act, 21 U.S.C. section 360bbb-3(b)(1), unless the authorization is terminated or revoked sooner.  Performed at [facility], [address] [place], [place] [address]   Blood culture (routine x 2)     Status: None (Preliminary result)   Collection Time: 08/28/19  1:51 AM   Specimen: BLOOD  Result Value Ref Range Status   Specimen Description BLOOD LAC  Final   Special Requests BOTTLES DRAWN AEROBIC AND ANAEROBIC BCAV  Final   Culture   Final    NO GROWTH 1 DAY Performed at [facility], [address] [place], [place] [address]    Report Status PENDING  Incomplete  Chlamydia/NGC rt PCR ([facility] only)     Status: None   Collection Time: 08/28/19  6:44 AM   Specimen: Urine, Clean Catch  Result Value Ref Range Status   Specimen source GC/Chlam URINE, RANDOM  Final   Chlamydia Tr NOT DETECTED NOT DETECTED Final   N gonorrhoeae NOT DETECTED NOT DETECTED Final    Comment: (NOTE) This CT/NG assay has not been evaluated in patients with a history of  hysterectomy. Performed at [facility], [address] [place], [place] [address]   Culture, blood (Routine X 2) w Reflex to ID Panel     Status: None (Preliminary result)   Collection Time: 08/28/19  6:49 AM   Specimen: BLOOD  Result Value Ref Range Status   Specimen Description BLOOD RIGHT [facility]  Final   Special Requests   Final    BOTTLES DRAWN AEROBIC AND ANAEROBIC Blood Culture adequate volume   Culture   Final    NO GROWTH 1 DAY Performed at [facility], [address] [place], [place] [address]    Report Status PENDING  Incomplete    Other Imaging: 08/26/19 at [facility] CT Abd and Pelvis IMPRESSION:  1. Dilated tubular structure in the RIGHT adnexa up to 1.9 cm diameter appears to represent the fallopian tube, with adjacent inflammatory stranding and trace free fluid in the cul-de-sac, configuration suggests hydrosalpinx/pyosalpinx  potentially related to PID depending on the clinical setting  2. No acute intestinal obstruction or inflammatory change.    Assessment   29 y.o. G1P1001 Hospital Day: 2   Plan   1. PID with hydro/pyosalpinx.  - IV antibiotics: Cefoxitin and Doxycycline to continue for at least 24hrs after last fever.    - Pain mgmt as ordered - activity as tolerated, regular diet.  - improving sx 24hrs after initiation of IV abx, less pain overall and temp max 100 at 0312 8/24.   - WBCs 13.2 (8/22 at outside fac) --> 21.8 --> 17.7-->12.1; will repeat in Am.  - mild hypokalemia noted on CMP  done in ER; repeat BMP 3.7  - POC and mgmt per consultation with and directed by Dr Dalbert Garnet, attending Ob/Gyn physician. - Will continue inpatient antibiotics d/t continued RLQ pain.   - Transition to oral antibiotics later today - Possible discharge home tomorrow as long as continues to clinically improve.     Margaretmary Eddy, CNM Certified Nurse Midwife [facility]

## 2019-08-30 LAB — CBC WITH DIFFERENTIAL/PLATELET
Abs Immature Granulocytes: 0.06 10*3/uL (ref 0.00–0.07)
Basophils Absolute: 0 10*3/uL (ref 0.0–0.1)
Basophils Relative: 0 %
Eosinophils Absolute: 0.1 10*3/uL (ref 0.0–0.5)
Eosinophils Relative: 1 %
HCT: 29.1 % — ABNORMAL LOW (ref 36.0–46.0)
Hemoglobin: 9.2 g/dL — ABNORMAL LOW (ref 12.0–15.0)
Immature Granulocytes: 1 %
Lymphocytes Relative: 20 %
Lymphs Abs: 1.7 10*3/uL (ref 0.7–4.0)
MCH: 26.6 pg (ref 26.0–34.0)
MCHC: 31.6 g/dL (ref 30.0–36.0)
MCV: 84.1 fL (ref 80.0–100.0)
Monocytes Absolute: 0.9 10*3/uL (ref 0.1–1.0)
Monocytes Relative: 10 %
Neutro Abs: 6 10*3/uL (ref 1.7–7.7)
Neutrophils Relative %: 68 %
Platelets: 268 10*3/uL (ref 150–400)
RBC: 3.46 MIL/uL — ABNORMAL LOW (ref 3.87–5.11)
RDW: 14.7 % (ref 11.5–15.5)
WBC: 8.8 10*3/uL (ref 4.0–10.5)
nRBC: 0 % (ref 0.0–0.2)

## 2019-08-30 MED ORDER — DOXYCYCLINE HYCLATE 100 MG PO TABS: 100.0000 mg | ORAL_TABLET | Freq: Two times a day (BID) | ORAL | 0 refills | Status: AC

## 2019-08-30 MED ORDER — DOXYCYCLINE HYCLATE 100 MG PO TABS
100.0000 mg | ORAL_TABLET | Freq: Once | ORAL | Status: AC
  Administered 2019-08-30: 100 mg via ORAL
  Filled 2019-08-30: qty 1

## 2019-08-30 MED ORDER — METRONIDAZOLE 500 MG PO TABS: 500.0000 mg | ORAL_TABLET | Freq: Two times a day (BID) | ORAL | 0 refills | Status: AC

## 2019-08-30 MED ORDER — METRONIDAZOLE 500 MG PO TABS
500.0000 mg | ORAL_TABLET | Freq: Once | ORAL | Status: AC
  Administered 2019-08-30: 500 mg via ORAL
  Filled 2019-08-30: qty 1

## 2019-08-30 NOTE — Progress Notes (Signed)
Pt discharged home. Discharge instructions, prescriptions, and follow up appointments given to and reviewed with pt. Pt verbalized understanding. Escorted by axillary.   

## 2019-08-30 NOTE — Discharge Summary (Signed)
Gynecological Discharge Summary  Patient Name: Alexa Hicks DOB: 1990-10-27 MRN: [id number]  Date of Admission: 08/28/2019 Date of Discharge: 08/30/2019  Hospital course:   The patient was admitted for pelvic pain and fever.  Started on Cefoxitin and Doxycycline.  WBC count returned to normal and pt became afebrile.  Patient was felt to be stable for discharge when she was tolerating a regular diet, pain resolved, and she was ambulating and voiding without difficulty. Vital signs were stable and physical exam remained benign throughout her hospital stay. Rx given for Doxycycline and Flagyl x 14 days.    She was given precautions and where and when to follow up. She verbalized understanding, agrees with the plan of care, and all questions answered to her satisfaction.  Discharge Physical Exam:  BP 114/86 (BP Location: Right Arm)   Pulse 87   Temp 99.5 F (37.5 C) (Oral)   Resp 20   Ht 5\' 6"  (1.676 m)   Wt 67.2 kg   SpO2 100% Comment: Room Air  BMI 23.91 kg/m   General: NAD CV: RRR Pulm: nl effort ABD: s/nd/nt DVT Evaluation: LE non-ttp, no evidence of DVT on exam.  Hemoglobin  Date Value Ref Range Status  08/30/2019 9.2 (L) 12.0 - 15.0 g/dL Final   HCT  Date Value Ref Range Status  08/30/2019 29.1 (L) 36 - 46 % Final      Plan:  09/01/2019 was discharged to home in good condition. Follow-up appointment at [facility] in 1 weeks   Discharge Medications: Allergies as of 08/30/2019   No Known Allergies     Medication List    TAKE these medications   doxycycline 100 MG tablet Commonly known as: VIBRA-TABS Take 1 tablet (100 mg total) by mouth 2 (two) times daily for 14 days.   metroNIDAZOLE 500 MG tablet Commonly known as: FLAGYL Take 1 tablet (500 mg total) by mouth 2 (two) times daily for 14 days.        Follow-up Information    [facility]. Schedule an appointment as soon as possible for a visit in 1 week(s).    Why: Please call to schedule an appointment for one week follow up Contact information: [address] [place] [address]              Signed: [phone], CNM 12:41 PM

## 2019-09-02 LAB — CULTURE, BLOOD (ROUTINE X 2)
Culture: NO GROWTH
Culture: NO GROWTH
Special Requests: ADEQUATE

## 2019-10-15 ENCOUNTER — Other Ambulatory Visit: Payer: 59

## 2019-10-15 DIAGNOSIS — Z20822 Contact with and (suspected) exposure to covid-19: Secondary | ICD-10-CM

## 2019-10-16 LAB — SARS-COV-2, NAA 2 DAY TAT

## 2019-10-16 LAB — NOVEL CORONAVIRUS, NAA: SARS-CoV-2, NAA: NOT DETECTED

## 2019-11-07 ENCOUNTER — Ambulatory Visit (INDEPENDENT_AMBULATORY_CARE_PROVIDER_SITE_OTHER): Payer: 59 | Admitting: Family Medicine

## 2019-11-07 ENCOUNTER — Ambulatory Visit: Payer: Self-pay

## 2019-11-07 ENCOUNTER — Encounter: Payer: Self-pay | Admitting: Family Medicine

## 2019-11-07 ENCOUNTER — Other Ambulatory Visit: Payer: Self-pay

## 2019-11-07 VITALS — BP 110/74 | HR 77 | Temp 98.4°F | Ht 66.0 in | Wt 147.2 lb

## 2019-11-07 DIAGNOSIS — Z111 Encounter for screening for respiratory tuberculosis: Secondary | ICD-10-CM | POA: Diagnosis not present

## 2019-11-07 DIAGNOSIS — Z0000 Encounter for general adult medical examination without abnormal findings: Secondary | ICD-10-CM

## 2019-11-07 DIAGNOSIS — R635 Abnormal weight gain: Secondary | ICD-10-CM | POA: Diagnosis not present

## 2019-11-07 DIAGNOSIS — Z23 Encounter for immunization: Secondary | ICD-10-CM | POA: Diagnosis not present

## 2019-11-07 DIAGNOSIS — E049 Nontoxic goiter, unspecified: Secondary | ICD-10-CM | POA: Diagnosis not present

## 2019-11-07 NOTE — Patient Instructions (Signed)
Preventive Care 21-30 Years Old, Female Preventive care refers to visits with your health care provider and lifestyle choices that can promote health and wellness. This includes:  A yearly physical exam. This may also be called an annual well check.  Regular dental visits and eye exams.  Immunizations.  Screening for certain conditions.  Healthy lifestyle choices, such as eating a healthy diet, getting regular exercise, not using drugs or products that contain nicotine and tobacco, and limiting alcohol use. What can I expect for my preventive care visit? Physical exam Your health care provider will check your:  Height and weight. This may be used to calculate body mass index (BMI), which tells if you are at a healthy weight.  Heart rate and blood pressure.  Skin for abnormal spots. Counseling Your health care provider may ask you questions about your:  Alcohol, tobacco, and drug use.  Emotional well-being.  Home and relationship well-being.  Sexual activity.  Eating habits.  Work and work environment.  Method of birth control.  Menstrual cycle.  Pregnancy history. What immunizations do I need?  Influenza (flu) vaccine  This is recommended every year. Tetanus, diphtheria, and pertussis (Tdap) vaccine  You may need a Td booster every 10 years. Varicella (chickenpox) vaccine  You may need this if you have not been vaccinated. Human papillomavirus (HPV) vaccine  If recommended by your health care provider, you may need three doses over 6 months. Measles, mumps, and rubella (MMR) vaccine  You may need at least one dose of MMR. You may also need a second dose. Meningococcal conjugate (MenACWY) vaccine  One dose is recommended if you are age 19-21 years and a first-year college student living in a residence hall, or if you have one of several medical conditions. You may also need additional booster doses. Pneumococcal conjugate (PCV13) vaccine  You may need  this if you have certain conditions and were not previously vaccinated. Pneumococcal polysaccharide (PPSV23) vaccine  You may need one or two doses if you smoke cigarettes or if you have certain conditions. Hepatitis A vaccine  You may need this if you have certain conditions or if you travel or work in places where you may be exposed to hepatitis A. Hepatitis B vaccine  You may need this if you have certain conditions or if you travel or work in places where you may be exposed to hepatitis B. Haemophilus influenzae type b (Hib) vaccine  You may need this if you have certain conditions. You may receive vaccines as individual doses or as more than one vaccine together in one shot (combination vaccines). Talk with your health care provider about the risks and benefits of combination vaccines. What tests do I need?  Blood tests  Lipid and cholesterol levels. These may be checked every 5 years starting at age 20.  Hepatitis C test.  Hepatitis B test. Screening  Diabetes screening. This is done by checking your blood sugar (glucose) after you have not eaten for a while (fasting).  Sexually transmitted disease (STD) testing.  BRCA-related cancer screening. This may be done if you have a family history of breast, ovarian, tubal, or peritoneal cancers.  Pelvic exam and Pap test. This may be done every 3 years starting at age 21. Starting at age 30, this may be done every 5 years if you have a Pap test in combination with an HPV test. Talk with your health care provider about your test results, treatment options, and if necessary, the need for more tests.   Follow these instructions at home: Eating and drinking   Eat a diet that includes fresh fruits and vegetables, whole grains, lean protein, and low-fat dairy.  Take vitamin and mineral supplements as recommended by your health care provider.  Do not drink alcohol if: ? Your health care provider tells you not to drink. ? You are  pregnant, may be pregnant, or are planning to become pregnant.  If you drink alcohol: ? Limit how much you have to 0-1 drink a day. ? Be aware of how much alcohol is in your drink. In the [place] one drink equals one 12 oz bottle of beer (355 mL), one 5 oz glass of wine (148 mL), or one 1 oz glass of hard liquor (44 mL). Lifestyle  Take daily care of your teeth and gums.  Stay active. Exercise for at least 30 minutes on 5 or more days each week.  Do not use any products that contain nicotine or tobacco, such as cigarettes, e-cigarettes, and chewing tobacco. If you need help quitting, ask your health care provider.  If you are sexually active, practice safe sex. Use a condom or other form of birth control (contraception) in order to prevent pregnancy and STIs (sexually transmitted infections). If you plan to become pregnant, see your health care provider for a preconception visit. What's next?  Visit your health care provider once a year for a well check visit.  Ask your health care provider how often you should have your eyes and teeth checked.  Stay up to date on all vaccines. This information is not intended to replace advice given to you by your health care provider. Make sure you discuss any questions you have with your health care provider. Document Revised: 09/01/2017 Document Reviewed: 09/01/2017 Elsevier Patient Education  2020 Elsevier Inc.  Goiter  A goiter is an enlarged thyroid gland. The thyroid is located in the lower front of the neck. It makes hormones that affect many body parts and systems, including the system that affects how quickly the body burns fuel for energy (metabolism). Most goiters are painless and are not a cause for concern. Some goiters can affect the way your thyroid makes thyroid hormones. Goiters and conditions that cause goiters can be treated, if necessary. What are the causes? Common causes of this condition include:  Lack (deficiency) of a  mineral called iodine. The thyroid gland uses iodine to make thyroid hormones.  Diseases that attack healthy cells in the body (autoimmune diseases) and affect thyroid function, such as Graves' disease or Hashimoto's disease. These diseases may cause the body to produce too much thyroid hormone (hyperthyroidism) or too little of the hormone (hypothyroidism).  Conditions that cause inflammation of the thyroid (thyroiditis).  One or more small growths on the thyroid (nodular goiter). Other causes include:  Medical problems caused by abnormal genes that are passed from parent to child (genetic defects).  Thyroid injury or infection.  Tumors that may or may not be cancerous.  Pregnancy.  Certain medicines.  Exposure to radiation. In some cases, the cause may not be known. What increases the risk? This condition is more likely to develop in:  People who do not get enough iodine in their diet.  People who have a family history of goiter.  Women.  People who are older than age 40.  People who smoke tobacco.  People who have had exposure to radiation. What are the signs or symptoms? The main symptom of this condition is swelling in the lower, front   part of the neck. This swelling can range from a very small bump to a large lump. Other symptoms may include:  A tight feeling in the throat.  A hoarse voice.  Coughing.  Wheezing.  Difficulty swallowing or breathing.  Bulging veins in the neck.  Dizziness. When a goiter is the result of an overactive thyroid (hyperthyroidism), symptoms may also include:  Nervousness or restlessness.  Inability to tolerate heat.  Unexplained weight loss.  Diarrhea.  Change in the texture of hair or skin.  Changes in heartbeat, such as skipped beats, extra beats, or a rapid heart rate.  Loss of menstruation.  Shaky hands.  Increased appetite.  Sleep problems. When a goiter is the result of an underactive thyroid  (hypothyroidism), symptoms may also include:  Feeling like you have no energy (lethargy).  Inability to tolerate cold.  Weight gain that is not explained by a change in diet or exercise habits.  Dry skin.  Coarse hair.  Irregular menstrual periods.  Constipation.  Sadness or depression.  Fatigue. In some cases, there may not be any symptoms and the thyroid hormone levels may be normal. How is this diagnosed? This condition may be diagnosed based on your symptoms, your medical history, and a physical exam. You may have tests, such as:  Blood tests to check thyroid function.  Imaging tests, such as: ? Ultrasound. ? CT scan. ? MRI. ? Thyroid scan.  Removal of a tissue sample (biopsy) of the goiter or any nodules. The sample will be tested to check for cancer. How is this treated? Treatment for this condition depends on the cause and your symptoms. Treatment may include:  Medicines to regulate thyroid hormone levels.  Anti-inflammatory medicines or steroid medicines, if the goiter is caused by inflammation.  Iodine supplements or changes to your diet, if the goiter is caused by iodine deficiency.  Radioactive iodine treatment.  Surgery to remove your thyroid. In some cases, you may only need regular check-ups with your health care provider to monitor your condition, and you may not need treatment. Follow these instructions at home:  Follow instructions from your health care provider about any changes to your diet.  Take over-the-counter and prescription medicines only as told by your health care provider. These include supplements.  Do not use any products that contain nicotine or tobacco, such as cigarettes and e-cigarettes. If you need help quitting, ask your health care provider.  Keep all follow-up visits as told by your health care provider. This is important. Contact a health care provider if:  Your symptoms do not get better with treatment.  You have  nausea, vomiting, or diarrhea. Get help right away if:  You have sudden, unexplained confusion or other mental changes.  You have a fever.  You have chest pain.  You have trouble breathing or swallowing.  You suddenly become very weak.  You experience extreme restlessness.  You feel your heart racing. Summary  A goiter is an enlarged thyroid gland.  The thyroid gland is located in the lower front of the neck. It makes hormones that affect many body parts and systems, including the system that affects how quickly the body burns fuel for energy (metabolism).  The main symptom of this condition is swelling in the lower, front part of the neck. This swelling can range from a very small bump to a large lump.  Treatment for this condition depends on the cause and your symptoms. You may need medicines, supplements, or regular monitoring of your  condition. This information is not intended to replace advice given to you by your health care provider. Make sure you discuss any questions you have with your health care provider. Document Revised: 12/03/2016 Document Reviewed: 09/16/2016 Elsevier Patient Education  2020 [employer].

## 2019-11-07 NOTE — Progress Notes (Signed)
Subjective:     Alexa Hicks is a 29 y.o. female and is here for a comprehensive physical exam. The patient reports no problems.  States she is doing well overall.  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 Pt initially seen in ED on 8/22 with abdominal pain.  Patient returned on 8/24 at which time she was hospitalized with PID and treated with IV antibiotic.  Patient states she has occasional abdominal pain but otherwise no issues.  Patient followed by OB/GYN at [facility].  Social History   Socioeconomic History  . Marital status: Married    Spouse name: Not on file  . Number of children: Not on file  . Years of education: Not on file  . Highest education level: Not on file  Occupational History  . Not on file  Tobacco Use  . Smoking status: Never Smoker  . Smokeless tobacco: Never Used  Substance and Sexual Activity  . Alcohol use: Yes    Alcohol/week: 2.0 standard drinks    Types: 2 Standard drinks or equivalent per week  . Drug use: No  . Sexual activity: Yes  Other Topics Concern  . Not on file  Social History Narrative  . Not on file   Social Determinants of Health   Financial Resource Strain:   . Difficulty of Paying Living Expenses: Not on file  Food Insecurity:   . Worried About Programme researcher, broadcasting/film/video in the Last Year: Not on file  . Ran Out of Food in the Last Year: Not on file  Transportation Needs:   . Lack of Transportation (Medical): Not on file  . Lack of Transportation (Non-Medical): Not on file  Physical Activity:   . Days of Exercise per Week: Not on file  . Minutes of Exercise per Session: Not on file  Stress:   . Feeling of Stress : Not on file  Social Connections:   . Frequency of Communication with Friends and Family: Not on file  . Frequency of Social  Gatherings with Friends and Family: Not on file  . Attends Religious Services: Not on file  . Active Member of Clubs or Organizations: Not on file  . Attends Banker Meetings: Not on file  . Marital Status: Not on file  Intimate Partner Violence:   . Fear of Current or Ex-Partner: Not on file  . Emotionally Abused: Not on file  . Physically Abused: Not on file  . Sexually Abused: Not on file   Health Maintenance  Topic Date Due  . Hepatitis C Screening  Never done  . COVID-19 Vaccine (1) Never done  . HIV Screening  Never done  . TETANUS/TDAP  Never done  . INFLUENZA VACCINE  Never done  . PAP SMEAR-Modifier  Discontinued    The following portions of the patient's history were reviewed and updated as appropriate: allergies, current medications, past family history, past medical history, past social history, past surgical history and problem list.  Review of Systems Pertinent items noted in HPI and remainder of comprehensive ROS otherwise negative.   Objective:    BP 110/74 (BP Location: Left Arm, Patient Position: Sitting, Cuff Size: Normal)   Pulse 77   Temp 98.4 F (36.9 C) (Oral)   Ht 5\' 6"  (1.676 m)   Wt 147 lb 3.2  oz (66.8 kg)   LMP 10/11/2019 (Exact Date)   SpO2 98%   BMI 23.76 kg/m  General appearance: alert, cooperative and no distress Head: Normocephalic, without obvious abnormality, atraumatic Eyes: conjunctivae/corneas clear. PERRL, EOM's intact. Fundi benign. Ears: normal TM's and external ear canals both ears Nose: Nares normal. Septum midline. Mucosa normal. No drainage or sinus tenderness. Throat: lips, mucosa, and tongue normal; teeth and gums normal Neck: no adenopathy, no carotid bruit, no JVD, supple, symmetrical, trachea midline.  Right lobe thyroid mildly enlarged, no tenderness/mass/nodules Lungs: clear to auscultation bilaterally Heart: regular rate and rhythm, S1, S2 normal, no murmur, click, rub or gallop Abdomen: soft, non-tender;  bowel sounds normal; no masses,  no organomegaly Extremities: extremities normal, atraumatic, no cyanosis or edema Pulses: 2+ and symmetric Skin: Skin color, texture, turgor normal. No rashes or lesions Lymph nodes: Cervical, supraclavicular, and axillary nodes normal. Neurologic: Alert and oriented X 3, normal strength and tone. Normal symmetric reflexes. Normal coordination and gait    Assessment:    Healthy female exam.      Plan:     Anticipatory guidance given including wearing seatbelts, smoke detectors in the home, increasing physical activity, increasing p.o. intake of water and vegetables. -We will obtain labs -With OB/GYN -Given handout -Next CPE in 1 year -Form completed for substitute teaching.  Tdap given this visit.  QuantiFERON gold TB testing done this visit. See After Visit Summary for Counseling Recommendations    Goiter  - Plan: TSH, T4, free  Weight gain  - Plan: TSH, T4, free, Hemoglobin A1c, Lipid panel  Visit for TB skin test  - Plan: QuantiFERON-TB Gold Plus  Need for Tdap vaccination  - Plan: Tdap vaccine greater than or equal to 7yo IM  Follow-up as needed  Abbe Amsterdam, MD

## 2019-11-09 ENCOUNTER — Telehealth: Payer: Self-pay | Admitting: Family Medicine

## 2019-11-09 NOTE — Telephone Encounter (Signed)
Pt is calling to get her lab results pt is aware that the provider has not resulted the results and when they have someone will give her a call to give her the results.  Pt verbalized understanding and will wait to hear back from someone in the office.

## 2019-11-09 NOTE — Telephone Encounter (Signed)
Patient is requesting nurse call her with her lab results from 11/3. FYI

## 2019-11-12 NOTE — Telephone Encounter (Signed)
Pt called for lab results.  Call pt [phone].  TB blood work pt needs for employer & paper pt gave PCP to complete for TB results.

## 2019-11-13 NOTE — Telephone Encounter (Signed)
Pt is calling to check the status of the lab work and to see when she can pick up her pw and get her lab results.  Pt is aware when the provider has resulted the labs someone will give her a call.  Pt stated that she asked to do a TB skin test and so that she would be able to start work 11/09/2019.  Pt would like to have a call back.

## 2019-11-13 NOTE — Telephone Encounter (Signed)
Pt is aware that the lab results for her TB test has not been released and is still in the progress

## 2019-11-13 NOTE — Telephone Encounter (Signed)
Office Head Nurse Mariana Single spoke with pt advised pt that she will get a call from the office once her lab results are reviewed and released by Dr Salomon Fick. Pt verbalized understanding

## 2019-11-14 ENCOUNTER — Telehealth: Payer: Self-pay

## 2019-11-14 LAB — CBC WITH DIFFERENTIAL/PLATELET
Absolute Monocytes: 501 cells/uL (ref 200–950)
Basophils Absolute: 52 cells/uL (ref 0–200)
Basophils Relative: 0.8 %
Eosinophils Absolute: 33 cells/uL (ref 15–500)
Eosinophils Relative: 0.5 %
HCT: 38.2 % (ref 35.0–45.0)
Hemoglobin: 12 g/dL (ref 11.7–15.5)
Lymphs Abs: 2243 cells/uL (ref 850–3900)
MCH: 26.3 pg — ABNORMAL LOW (ref 27.0–33.0)
MCHC: 31.4 g/dL — ABNORMAL LOW (ref 32.0–36.0)
MCV: 83.6 fL (ref 80.0–100.0)
MPV: 11.2 fL (ref 7.5–12.5)
Monocytes Relative: 7.7 %
Neutro Abs: 3673 cells/uL (ref 1500–7800)
Neutrophils Relative %: 56.5 %
Platelets: 352 10*3/uL (ref 140–400)
RBC: 4.57 10*6/uL (ref 3.80–5.10)
RDW: 13.1 % (ref 11.0–15.0)
Total Lymphocyte: 34.5 %
WBC: 6.5 10*3/uL (ref 3.8–10.8)

## 2019-11-14 LAB — LIPID PANEL
Cholesterol: 136 mg/dL (ref <200)
HDL: 54 mg/dL (ref >=50)
LDL Cholesterol (Calc): 69 mg/dL (calc)
Non-HDL Cholesterol (Calc): 82 mg/dL (calc) (ref <130)
Total CHOL/HDL Ratio: 2.5 (calc) (ref <5.0)
Triglycerides: 47 mg/dL (ref <150)

## 2019-11-14 LAB — QUANTIFERON-TB GOLD PLUS
Mitogen-NIL: >10 IU/mL
NIL: 0.07 IU/mL
QuantiFERON-TB Gold Plus: NEGATIVE
TB1-NIL: 0 IU/mL
TB2-NIL: <0 IU/mL

## 2019-11-14 LAB — BASIC METABOLIC PANEL WITH GFR
BUN: 11 mg/dL (ref 7–25)
CO2: 26 mmol/L (ref 20–32)
Calcium: 9.9 mg/dL (ref 8.6–10.2)
Chloride: 104 mmol/L (ref 98–110)
Creat: 0.78 mg/dL (ref 0.50–1.10)
GFR, Est African American: 118 mL/min/{1.73_m2} (ref >=60)
GFR, Est Non African American: 102 mL/min/{1.73_m2} (ref >=60)
Glucose, Bld: 95 mg/dL (ref 65–99)
Potassium: 4.3 mmol/L (ref 3.5–5.3)
Sodium: 137 mmol/L (ref 135–146)

## 2019-11-14 LAB — HEMOGLOBIN A1C
Hgb A1c MFr Bld: 5.4 % of total Hgb (ref <5.7)
Mean Plasma Glucose: 108 (calc)
eAG (mmol/L): 6 (calc)

## 2019-11-14 LAB — TSH: TSH: 2.66 mIU/L

## 2019-11-14 LAB — T4, FREE: Free T4: 1.1 ng/dL (ref 0.8–1.8)

## 2019-11-14 NOTE — Telephone Encounter (Signed)
Pt lab results were received this morning and pt was notified. Pt picked up the form and the copy of the results from the office this afternoon. A copy was sent to scanning

## 2019-11-14 NOTE — Telephone Encounter (Signed)
Called and spoke with a [facility] Tech  regarding pt Quantiferon - TB Gold Plus results, Tech stated that pt labs are ready and that she is faxing a copy and also releasing the results on Epic. Pt will be notified as soon as results are released and reviewed by Dr Salomon Fick

## 2019-11-15 ENCOUNTER — Telehealth: Payer: Self-pay | Admitting: Family Medicine

## 2019-11-15 NOTE — Telephone Encounter (Signed)
Late entry:  Pt called around 5:15pm/5:20 pm by this provider regarding lab results. Detailed VM left regarding results and error messages listed on results given to pt due to dictation device incorrectly entering a voice command.  Call was made after pt spoke with CMA and reportedly became upset. This provider attempted to speak with pt while on that initial call, however pt disconnected the call as provider was handed the phone.  Abbe Amsterdam, MD

## 2019-11-16 ENCOUNTER — Telehealth: Payer: Self-pay

## 2019-11-16 NOTE — Telephone Encounter (Signed)
Called pt and reviewed pt lab results after hours yesterday, Due to a dragon error while dictating the results they read chronic furuncle on pt results which I corrected and tried to explain to pt that this was a dictation error and continued to read the corrected results to pt but she was already too upset to listen to the rest of the results note. I apologized for the error to pt but she did not let me explain the problem. Stated that I had her worried for no reason and that I had no reason to call her after hour giving the wrong lab results. Pt stated that I did not know how to do my job and that she would like to speak with a Merchandiser, retail. I explained that the office was closed and that I would have a supervisor contact her in the morning. I attempted to have Dr Salomon Fick speak with pt but pt hang up the phone. Clinical supervisor was notified this morning to call pt as requested.

## 2019-12-31 ENCOUNTER — Other Ambulatory Visit: Payer: 59

## 2019-12-31 DIAGNOSIS — Z20822 Contact with and (suspected) exposure to covid-19: Secondary | ICD-10-CM

## 2020-01-01 LAB — SARS-COV-2, NAA 2 DAY TAT

## 2020-01-01 LAB — NOVEL CORONAVIRUS, NAA: SARS-CoV-2, NAA: NOT DETECTED

## 2020-07-31 ENCOUNTER — Other Ambulatory Visit: Payer: Self-pay | Admitting: Obstetrics and Gynecology

## 2020-07-31 DIAGNOSIS — E041 Nontoxic single thyroid nodule: Secondary | ICD-10-CM

## 2020-08-05 ENCOUNTER — Encounter: Payer: Self-pay | Admitting: Family Medicine

## 2020-08-05 ENCOUNTER — Other Ambulatory Visit: Payer: Self-pay

## 2020-08-05 ENCOUNTER — Ambulatory Visit
Admission: RE | Admit: 2020-08-05 | Discharge: 2020-08-05 | Disposition: A | Discharge status: Home / Self Care | Payer: 59 | Source: Ambulatory Visit | Attending: Obstetrics and Gynecology | Admitting: Obstetrics and Gynecology

## 2020-08-05 DIAGNOSIS — E041 Nontoxic single thyroid nodule: Secondary | ICD-10-CM

## 2020-11-19 LAB — OB RESULTS CONSOLE GC/CHLAMYDIA
Chlamydia: NEGATIVE
Neisseria Gonorrhea: NEGATIVE

## 2020-11-19 LAB — OB RESULTS CONSOLE ABO/RH: RH Type: POSITIVE

## 2020-11-19 LAB — OB RESULTS CONSOLE HIV ANTIBODY (ROUTINE TESTING): HIV: NONREACTIVE

## 2020-11-19 LAB — HEPATITIS C ANTIBODY: HCV Ab: NEGATIVE

## 2020-11-19 LAB — OB RESULTS CONSOLE RUBELLA ANTIBODY, IGM: Rubella: IMMUNE

## 2020-11-19 LAB — OB RESULTS CONSOLE HEPATITIS B SURFACE ANTIGEN: Hepatitis B Surface Ag: NEGATIVE

## 2020-11-19 LAB — OB RESULTS CONSOLE ANTIBODY SCREEN: Antibody Screen: NEGATIVE

## 2020-11-19 LAB — OB RESULTS CONSOLE RPR: RPR: NONREACTIVE

## 2021-01-04 NOTE — L&D Delivery Note (Signed)
Delivery Note   At 4:31 AM a healthy female was delivered via Vaginal, Spontaneous (Presentation:   Occiput Anterior).  APGAR: 8, 9; weight  pending.   Placenta status: Spontaneous, Intact.  Cord: 3 vessels with the following complications: None.   Anesthesia: Epidural  Episiotomy: none Lacerations: small  first degree Suture Repair: 3.0 vicryl rapide Est. Blood Loss (mL):    Mom to postpartum.  Baby to Couplet care / Skin to Skin.  D/w parents circumcision and they desire  Alexa Hicks 06/18/2021, 5:06 AM

## 2021-05-25 ENCOUNTER — Non-Acute Institutional Stay (HOSPITAL_COMMUNITY)
Admission: RE | Admit: 2021-05-25 | Discharge: 2021-05-25 | Disposition: A | Discharge status: Home / Self Care | Payer: BC Managed Care – PPO | Source: Ambulatory Visit | Attending: Internal Medicine | Admitting: Internal Medicine

## 2021-05-25 DIAGNOSIS — O99019 Anemia complicating pregnancy, unspecified trimester: Secondary | ICD-10-CM | POA: Diagnosis present

## 2021-05-25 MED ORDER — SODIUM CHLORIDE 0.9 % IV SOLN: INTRAVENOUS | Status: DC | PRN

## 2021-05-25 MED ORDER — SODIUM CHLORIDE 0.9 % IV SOLN
510.0000 mg | Freq: Once | INTRAVENOUS | Status: AC
  Administered 2021-05-25: 510 mg via INTRAVENOUS
  Filled 2021-05-25: qty 510

## 2021-05-25 NOTE — Progress Notes (Signed)
PATIENT CARE CENTER NOTE   Diagnosis: Anemia D64.9   Provider: Huel Cote, MD   Procedure: Feraheme infusion    Note:  Patient received Feraheme infusion (dose 1 of 1) via PIV. Patient tolerated well with no adverse reaction. Observed patient for 30 minutes post infusion. Vital signs stable. Discharge instructions given. Patient will follow up with provider to see if second infusion is needed.  Patient alert, oriented and ambulatory at discharge. Discharged home with husband.

## 2021-05-26 LAB — OB RESULTS CONSOLE GBS: GBS: NEGATIVE

## 2021-05-31 ENCOUNTER — Encounter: Payer: Self-pay | Admitting: Obstetrics and Gynecology

## 2021-05-31 DIAGNOSIS — D509 Iron deficiency anemia, unspecified: Secondary | ICD-10-CM | POA: Insufficient documentation

## 2021-05-31 NOTE — Progress Notes (Signed)
Pt has chronic iron deficiency anemia exacerbated by pregnancy, for IV iron infusion x 2

## 2021-06-03 ENCOUNTER — Non-Acute Institutional Stay (HOSPITAL_COMMUNITY)
Admission: RE | Admit: 2021-06-03 | Discharge: 2021-06-03 | Disposition: A | Discharge status: Home / Self Care | Payer: BC Managed Care – PPO | Source: Ambulatory Visit | Attending: Internal Medicine | Admitting: Internal Medicine

## 2021-06-03 DIAGNOSIS — D649 Anemia, unspecified: Secondary | ICD-10-CM | POA: Insufficient documentation

## 2021-06-03 MED ORDER — SODIUM CHLORIDE 0.9 % IV SOLN: INTRAVENOUS | Status: DC | PRN

## 2021-06-03 MED ORDER — SODIUM CHLORIDE 0.9 % IV SOLN
510.0000 mg | Freq: Once | INTRAVENOUS | Status: AC
  Administered 2021-06-03: 510 mg via INTRAVENOUS
  Filled 2021-06-03: qty 510

## 2021-06-03 NOTE — Progress Notes (Signed)
PATIENT CARE CENTER NOTE:  Diagnosis: Anemia D64.9  Provider:  Huel Cote MD  Procedure: Feraheme 510mg    Patient received IV Feraheme ( dose 1 of 1). Pt states she previously had iron on 5/22 and tolerated without difficulty. No premeds required per orders.  Pt tolerated infusion well today, vitals signs stable.  Observed for at least 30 minutes post infusion.  Pt declined AVS.Patient alert, oriented, and ambulatory at the time of discharge.

## 2021-06-08 ENCOUNTER — Encounter (HOSPITAL_COMMUNITY): Payer: Self-pay | Admitting: *Deleted

## 2021-06-08 ENCOUNTER — Telehealth (HOSPITAL_COMMUNITY): Payer: Self-pay | Admitting: *Deleted

## 2021-06-08 NOTE — Telephone Encounter (Signed)
Preadmission screen  

## 2021-06-16 ENCOUNTER — Other Ambulatory Visit: Payer: Self-pay | Admitting: Obstetrics and Gynecology

## 2021-06-17 ENCOUNTER — Inpatient Hospital Stay (HOSPITAL_COMMUNITY): Payer: BC Managed Care – PPO | Admitting: Anesthesiology

## 2021-06-17 ENCOUNTER — Other Ambulatory Visit: Payer: Self-pay

## 2021-06-17 ENCOUNTER — Inpatient Hospital Stay (HOSPITAL_COMMUNITY): Payer: BC Managed Care – PPO

## 2021-06-17 ENCOUNTER — Inpatient Hospital Stay (HOSPITAL_COMMUNITY)
Admission: AD | Admit: 2021-06-17 | Discharge: 2021-06-19 | DRG: 807 | Disposition: A | Discharge status: Home / Self Care | Payer: BC Managed Care – PPO | Attending: Obstetrics and Gynecology | Admitting: Obstetrics and Gynecology

## 2021-06-17 ENCOUNTER — Encounter (HOSPITAL_COMMUNITY): Payer: Self-pay | Admitting: Obstetrics and Gynecology

## 2021-06-17 DIAGNOSIS — Z3A39 39 weeks gestation of pregnancy: Secondary | ICD-10-CM

## 2021-06-17 DIAGNOSIS — O9902 Anemia complicating childbirth: Principal | ICD-10-CM | POA: Diagnosis present

## 2021-06-17 DIAGNOSIS — D509 Iron deficiency anemia, unspecified: Secondary | ICD-10-CM | POA: Diagnosis present

## 2021-06-17 DIAGNOSIS — Z3490 Encounter for supervision of normal pregnancy, unspecified, unspecified trimester: Secondary | ICD-10-CM | POA: Diagnosis present

## 2021-06-17 DIAGNOSIS — O26893 Other specified pregnancy related conditions, third trimester: Secondary | ICD-10-CM | POA: Diagnosis present

## 2021-06-17 LAB — CBC
HCT: 35.9 % — ABNORMAL LOW (ref 36.0–46.0)
Hemoglobin: 11.4 g/dL — ABNORMAL LOW (ref 12.0–15.0)
MCH: 28.1 pg (ref 26.0–34.0)
MCHC: 31.8 g/dL (ref 30.0–36.0)
MCV: 88.6 fL (ref 80.0–100.0)
Platelets: 234 10*3/uL (ref 150–400)
RBC: 4.05 MIL/uL (ref 3.87–5.11)
RDW: 17.9 % — ABNORMAL HIGH (ref 11.5–15.5)
WBC: 11.9 10*3/uL — ABNORMAL HIGH (ref 4.0–10.5)
nRBC: 0 % (ref 0.0–0.2)

## 2021-06-17 LAB — TYPE AND SCREEN
ABO/RH(D): B POS
Antibody Screen: NEGATIVE

## 2021-06-17 MED ORDER — SOD CITRATE-CITRIC ACID 500-334 MG/5ML PO SOLN: 30.0000 mL | ORAL | Status: DC | PRN

## 2021-06-17 MED ORDER — OXYTOCIN BOLUS FROM INFUSION
333.0000 mL | Freq: Once | INTRAVENOUS | Status: AC
  Administered 2021-06-18: 333 mL via INTRAVENOUS

## 2021-06-17 MED ORDER — ONDANSETRON HCL 4 MG/2ML IJ SOLN: 4.0000 mg | Freq: Four times a day (QID) | INTRAMUSCULAR | Status: DC | PRN

## 2021-06-17 MED ORDER — LIDOCAINE HCL (PF) 1 % IJ SOLN
INTRAMUSCULAR | Status: DC | PRN
  Administered 2021-06-17: 11 mL via EPIDURAL

## 2021-06-17 MED ORDER — LACTATED RINGERS IV SOLN
500.0000 mL | Freq: Once | INTRAVENOUS | Status: AC
  Administered 2021-06-17: 500 mL via INTRAVENOUS

## 2021-06-17 MED ORDER — ACETAMINOPHEN 325 MG PO TABS: 650.0000 mg | ORAL_TABLET | ORAL | Status: DC | PRN

## 2021-06-17 MED ORDER — MISOPROSTOL 50MCG HALF TABLET
50.0000 ug | ORAL_TABLET | ORAL | Status: DC
  Administered 2021-06-17: 50 ug via BUCCAL
  Filled 2021-06-17: qty 1

## 2021-06-17 MED ORDER — LIDOCAINE HCL (PF) 1 % IJ SOLN: 30.0000 mL | INTRAMUSCULAR | Status: DC | PRN

## 2021-06-17 MED ORDER — EPHEDRINE 5 MG/ML INJ: 10.0000 mg | INTRAVENOUS | Status: DC | PRN

## 2021-06-17 MED ORDER — TERBUTALINE SULFATE 1 MG/ML IJ SOLN: 0.2500 mg | Freq: Once | INTRAMUSCULAR | Status: DC | PRN

## 2021-06-17 MED ORDER — LACTATED RINGERS IV SOLN
500.0000 mL | INTRAVENOUS | Status: DC | PRN
  Administered 2021-06-17 – 2021-06-18 (×2): 500 mL via INTRAVENOUS

## 2021-06-17 MED ORDER — LACTATED RINGERS IV SOLN: INTRAVENOUS | Status: DC

## 2021-06-17 MED ORDER — DIPHENHYDRAMINE HCL 50 MG/ML IJ SOLN: 12.5000 mg | INTRAMUSCULAR | Status: DC | PRN

## 2021-06-17 MED ORDER — PHENYLEPHRINE 80 MCG/ML (10ML) SYRINGE FOR IV PUSH (FOR BLOOD PRESSURE SUPPORT)
80.0000 ug | PREFILLED_SYRINGE | INTRAVENOUS | Status: DC | PRN
  Filled 2021-06-17 (×2): qty 10

## 2021-06-17 MED ORDER — OXYTOCIN-SODIUM CHLORIDE 30-0.9 UT/500ML-% IV SOLN
1.0000 m[IU]/min | INTRAVENOUS | Status: DC
  Administered 2021-06-17: 2 m[IU]/min via INTRAVENOUS
  Filled 2021-06-17: qty 500

## 2021-06-17 MED ORDER — FENTANYL CITRATE (PF) 100 MCG/2ML IJ SOLN: 100.0000 ug | INTRAMUSCULAR | Status: DC | PRN

## 2021-06-17 MED ORDER — OXYTOCIN-SODIUM CHLORIDE 30-0.9 UT/500ML-% IV SOLN
2.5000 [IU]/h | INTRAVENOUS | Status: DC
  Administered 2021-06-18: 2.5 [IU]/h via INTRAVENOUS

## 2021-06-17 MED ORDER — FENTANYL-BUPIVACAINE-NACL 0.5-0.125-0.9 MG/250ML-% EP SOLN
12.0000 mL/h | EPIDURAL | Status: DC | PRN
  Administered 2021-06-17: 12 mL/h via EPIDURAL
  Filled 2021-06-17: qty 250

## 2021-06-17 MED ORDER — PHENYLEPHRINE 80 MCG/ML (10ML) SYRINGE FOR IV PUSH (FOR BLOOD PRESSURE SUPPORT)
80.0000 ug | PREFILLED_SYRINGE | INTRAVENOUS | Status: DC | PRN
  Administered 2021-06-17 – 2021-06-18 (×2): 80 ug via INTRAVENOUS

## 2021-06-17 NOTE — Progress Notes (Signed)
Patient ID: Alexa Hicks, female   DOB: [birth year], 31 y.o.   MRN: [id number] Pt received cytotec x 1 and then started on pitocin.  She has been having mild/moderate contractions for several hours, coping ok.  FHR overall category 1 with occasional mild variables  Cervix 3/50/-2 AROM clear  Pitocin at 35mu, will titrate up to get more adequate.

## 2021-06-17 NOTE — Anesthesia Procedure Notes (Signed)
Epidural Patient location during procedure: OB Start time: 06/17/2021 9:41 PM End time: 06/17/2021 11:03 PM  Staffing Anesthesiologist: Lowella Curb, MD Performed: anesthesiologist   Preanesthetic Checklist Completed: patient identified, IV checked, site marked, risks and benefits discussed, surgical consent, monitors and equipment checked, pre-op evaluation and timeout performed  Epidural Patient position: sitting Prep: ChloraPrep Patient monitoring: heart rate, cardiac monitor, continuous pulse ox and blood pressure Approach: midline Location: L2-L3 Injection technique: LOR saline  Needle:  Needle type: Tuohy  Needle gauge: 17 G Needle length: 9 cm Needle insertion depth: 5 cm Catheter type: closed end flexible Catheter size: 20 Guage Catheter at skin depth: 9 cm Test dose: negative  Assessment Events: blood not aspirated, injection not painful, no injection resistance, no paresthesia and negative IV test  Additional Notes Reason for block:procedure for pain

## 2021-06-17 NOTE — H&P (Signed)
Alexa Hicks is a 31 y.o. female G2P1001 at 21 1/7 weeks (EDD 06/23/21 by LMP c/w 9 week [place]) presenting for IOL at term. Prenatal care uncomplicated except for significant anemia, received iron transfusion x 2.  OB History     Gravida  2   Para  1   Term  1   Preterm      AB      Living  1      SAB      IAB      Ectopic      Multiple      Live Births  1         11-28[birth year]2007, 39 wks F, 5lbs 7oz, Vaginal Delivery   Past Medical History:  Diagnosis Date   Frequent headaches    Hay fever    Past Surgical History:  Procedure Laterality Date   NO PAST SURGERIES     Family History: family history includes Arthritis in her maternal grandmother; Cancer in her maternal grandfather; Diabetes in her maternal grandmother; Early death in her maternal grandfather, paternal grandfather, and paternal grandmother; Stroke in her maternal grandmother. Social History:  reports that she has never smoked. She has never used smokeless tobacco. She reports current alcohol use of about 2.0 standard drinks of alcohol per week. She reports that she does not use drugs.     Maternal Diabetes: No Genetic Screening: Normal Maternal Ultrasounds/Referrals: Normal Fetal Ultrasounds or other Referrals:  None Maternal Substance Abuse:  No Significant Maternal Medications:  None Significant Maternal Lab Results:  Group B Strep negative Other Comments:  None  Review of Systems  Constitutional:  Negative for fever.  Gastrointestinal:  Negative for abdominal pain.  Genitourinary:  Negative for vaginal bleeding.   Maternal Medical History:  Contractions: Frequency: irregular.   Perceived severity is mild.   Fetal activity: Perceived fetal activity is none.   Prenatal Complications - Diabetes: none.  Dilation: 1.5 Effacement (%): 40 Station: -2 Exam by:: Dr. Senaida Hicks Blood pressure 113/72, pulse 86, temperature 97.8 F (36.6 C), temperature source Oral, resp. rate  16. Maternal Exam:  Uterine Assessment: Contraction strength is mild.  Contraction frequency is irregular.  Abdomen: Patient reports no abdominal tenderness. Fetal presentation: vertex Introitus: Normal vulva. Normal vagina.    Physical Exam Cardiovascular:     Rate and Rhythm: Normal rate and regular rhythm.  Pulmonary:     Effort: Pulmonary effort is normal.  Abdominal:     Palpations: Abdomen is soft.  Genitourinary:    General: Normal vulva.  Neurological:     Mental Status: She is alert.  Psychiatric:        Mood and Affect: Mood normal.    Prenatal labs: ABO, Rh: --/--/B POS (06/14 1105) Antibody: NEG (06/14 1105) Rubella: Immune (11/16 0000) RPR: Nonreactive (11/16 0000)  HBsAg: Negative (11/16 0000)  HIV: Non-reactive (11/16 0000)  GBS: Negative/-- (05/23 0000)  NIPT low risk Hgb AA One hour GCT 109  Assessment/Plan: Pt admitted for IOL at term.  Will give cytotec x 1 then reassess response.  FHR category 1   Alexa Hicks 06/17/2021, 12:15 PM

## 2021-06-17 NOTE — Anesthesia Preprocedure Evaluation (Signed)
Anesthesia Evaluation  Patient identified by MRN, date of birth, ID band Patient awake    Reviewed: Allergy & Precautions, NPO status , Patient's Chart, lab work & pertinent test results  Airway Mallampati: II  TM Distance: >3 FB Neck ROM: Full    Dental no notable dental hx.    Pulmonary neg pulmonary ROS,    Pulmonary exam normal breath sounds clear to auscultation       Cardiovascular negative cardio ROS Normal cardiovascular exam Rhythm:Regular Rate:Normal     Neuro/Psych negative neurological ROS  negative psych ROS   GI/Hepatic negative GI ROS, Neg liver ROS,   Endo/Other  negative endocrine ROS  Renal/GU negative Renal ROS  negative genitourinary   Musculoskeletal negative musculoskeletal ROS (+)   Abdominal   Peds negative pediatric ROS (+)  Hematology negative hematology ROS (+)   Anesthesia Other Findings   Reproductive/Obstetrics (+) Pregnancy                             Anesthesia Physical Anesthesia Plan  ASA: 2  Anesthesia Plan: Epidural   Post-op Pain Management:    Induction:   PONV Risk Score and Plan:   Airway Management Planned:   Additional Equipment:   Intra-op Plan:   Post-operative Plan:   Informed Consent:   Plan Discussed with:   Anesthesia Plan Comments:         Anesthesia Quick Evaluation  

## 2021-06-18 ENCOUNTER — Encounter (HOSPITAL_COMMUNITY): Payer: Self-pay | Admitting: Obstetrics and Gynecology

## 2021-06-18 LAB — RPR: RPR Ser Ql: NONREACTIVE

## 2021-06-18 MED ORDER — SALINE SPRAY 0.65 % NA SOLN
1.0000 | NASAL | Status: DC | PRN
  Administered 2021-06-18: 1 via NASAL
  Filled 2021-06-18: qty 44

## 2021-06-18 MED ORDER — SENNOSIDES-DOCUSATE SODIUM 8.6-50 MG PO TABS
2.0000 | ORAL_TABLET | ORAL | Status: DC
  Administered 2021-06-18: 2 via ORAL
  Filled 2021-06-18: qty 2

## 2021-06-18 MED ORDER — WITCH HAZEL-GLYCERIN EX PADS: 1.0000 "application " | MEDICATED_PAD | CUTANEOUS | Status: DC | PRN

## 2021-06-18 MED ORDER — PRENATAL MULTIVITAMIN CH
1.0000 | ORAL_TABLET | Freq: Every day | ORAL | Status: DC
  Administered 2021-06-18 – 2021-06-19 (×2): 1 via ORAL
  Filled 2021-06-18 (×2): qty 1

## 2021-06-18 MED ORDER — BENZOCAINE-MENTHOL 20-0.5 % EX AERO
1.0000 "application " | INHALATION_SPRAY | CUTANEOUS | Status: DC | PRN
  Administered 2021-06-18 – 2021-06-19 (×2): 1 via TOPICAL
  Filled 2021-06-18 (×2): qty 56

## 2021-06-18 MED ORDER — IBUPROFEN 600 MG PO TABS
600.0000 mg | ORAL_TABLET | Freq: Four times a day (QID) | ORAL | Status: DC
  Administered 2021-06-18 – 2021-06-19 (×5): 600 mg via ORAL
  Filled 2021-06-18 (×5): qty 1

## 2021-06-18 MED ORDER — PSEUDOEPHEDRINE HCL 30 MG PO TABS
60.0000 mg | ORAL_TABLET | Freq: Four times a day (QID) | ORAL | Status: DC | PRN
Start: 2021-06-18 — End: 2021-06-19
  Filled 2021-06-18: qty 1

## 2021-06-18 MED ORDER — TETANUS-DIPHTH-ACELL PERTUSSIS 5-2.5-18.5 LF-MCG/0.5 IM SUSY: 0.5000 mL | PREFILLED_SYRINGE | Freq: Once | INTRAMUSCULAR | Status: DC

## 2021-06-18 MED ORDER — DIBUCAINE (PERIANAL) 1 % EX OINT: 1.0000 "application " | TOPICAL_OINTMENT | CUTANEOUS | Status: DC | PRN

## 2021-06-18 MED ORDER — SIMETHICONE 80 MG PO CHEW: 80.0000 mg | CHEWABLE_TABLET | ORAL | Status: DC | PRN

## 2021-06-18 MED ORDER — COCONUT OIL OIL: 1.0000 "application " | TOPICAL_OIL | Status: DC | PRN

## 2021-06-18 MED ORDER — ACETAMINOPHEN 325 MG PO TABS
650.0000 mg | ORAL_TABLET | ORAL | Status: DC | PRN
  Filled 2021-06-18: qty 2

## 2021-06-18 MED ORDER — DIPHENHYDRAMINE HCL 25 MG PO CAPS: 25.0000 mg | ORAL_CAPSULE | Freq: Four times a day (QID) | ORAL | Status: DC | PRN

## 2021-06-18 MED ORDER — ZOLPIDEM TARTRATE 5 MG PO TABS: 5.0000 mg | ORAL_TABLET | Freq: Every evening | ORAL | Status: DC | PRN

## 2021-06-18 MED ORDER — ONDANSETRON HCL 4 MG PO TABS: 4.0000 mg | ORAL_TABLET | ORAL | Status: DC | PRN

## 2021-06-18 MED ORDER — ONDANSETRON HCL 4 MG/2ML IJ SOLN: 4.0000 mg | INTRAMUSCULAR | Status: DC | PRN

## 2021-06-18 NOTE — Lactation Note (Signed)
This note was copied from a baby's chart. Lactation Consultation Note Baby just voided and stool for the first time. Mom has been concerned.  Mom stated baby latches better to Lt. Breast than Rt. Encouraged mom to use finger stimulation to evert nipple well before latching. Latched in football position to Rt. Breast. Baby latched well. Mom denies painful latch. Mom is cramping bad w/BF. Newborn feeding habits, positioning, support, I&O reviewed. Encouraged mom to call for assistance or questions as needed. Mom stated it has been 14 yrs since she BF her first child.  Patient Name: Alexa Hicks [id number] Date: 06/18/2021 Reason for consult: RN request;Term Age:4 hours  Maternal Data Has patient been taught Hand Expression?: Yes Does the patient have breastfeeding experience prior to this delivery?: Yes  Feeding    LATCH Score Latch: Grasps breast easily, tongue down, lips flanged, rhythmical sucking.  Audible Swallowing: A few with stimulation  Type of Nipple: Everted at rest and after stimulation  Comfort (Breast/Nipple): Filling, red/small blisters or bruises, mild/mod discomfort (breast feeling full)  Hold (Positioning): Assistance needed to correctly position infant at breast and maintain latch.  LATCH Score: 7   Lactation Tools Discussed/Used    Interventions Interventions: Breast feeding basics reviewed;Assisted with latch;Skin to skin;Breast massage;Breast compression;Adjust position;Support pillows;Position options  Discharge    Consult Status Consult Status: Follow-up Date: 06/19/21 Follow-up type: In-patient    Charyl Dancer 06/18/2021, 11:35 PM

## 2021-06-18 NOTE — Lactation Note (Signed)
This note was copied from a baby's chart. Lactation Consultation Note  Patient Name: Boy Sumaiya Arruda [id number] Date: 06/18/2021   Age:32 hours   LC Note:  Attempted to visit with mother, however, she is asleep at this time.  Support person present; informed her I will return later today.   Maternal Data    Feeding    LATCH Score Latch: Grasps breast easily, tongue down, lips flanged, rhythmical sucking.  Audible Swallowing: Spontaneous and intermittent  Type of Nipple: Everted at rest and after stimulation  Comfort (Breast/Nipple): Soft / non-tender  Hold (Positioning): Assistance needed to correctly position infant at breast and maintain latch.  LATCH Score: 9   Lactation Tools Discussed/Used    Interventions    Discharge    Consult Status      Eoin Willden R Lashanda Storlie 06/18/2021, 8:33 AM

## 2021-06-18 NOTE — Progress Notes (Signed)
Patient is eating, ambulating, voiding.  Pain control is good.  Vitals:   06/18/21 0515 06/18/21 0530 06/18/21 0557 06/18/21 0616  BP: 116/66 (!) 116/40 109/66 103/66  Pulse: (!) 103 (!) 101 96 92  Resp: 18 16    Temp: 98.2 F (36.8 C)     TempSrc:      SpO2:      Weight:      Height:        Fundus firm Perineum without swelling.  Lab Results  Component Value Date   WBC 11.9 (H) 06/17/2021   HGB 11.4 (L) 06/17/2021   HCT 35.9 (L) 06/17/2021   MCV 88.6 06/17/2021   PLT 234 06/17/2021    --/--/B POS (06/14 1105)/RI  A/P Post partum day 0.  Routine care.  Expect d/c routine.  Circ tomorrow.  Loney Laurence

## 2021-06-18 NOTE — Lactation Note (Signed)
This note was copied from a baby's chart. Lactation Consultation Note  Patient Name: Alexa Hicks [id number] Date: 06/18/2021 Reason for consult: Initial assessment;Term Age:32 hours   P2 mother whose infant is now 68 hours old.  This is a term baby at 39+2 weeks.  Mother's current feeding preference is breast.  She breast fed her first child (now 52 years old) for 6 months.  Mother had baby "Alexa Hicks" latched when I arrived; he appeared very sleepy.  Offered to help awaken and latch again; mother receptive.  Taught hand expression; mother unable to express drops.  Reassurance given.  Assisted to latch in the cross cradle hold and with constant gentle stimulation observed him feeding for 10 minutes.  Discussed nutritive vs non-nutritive sucking.    Reviewed breast feeding basics.  Mother will feed 8-12 times/24 hours or sooner if "Alexa Hicks" shows cues.  Encouraged to call her RN/LC for assistance as needed.  Visitors present.   Maternal Data Has patient been taught Hand Expression?: Yes Does the patient have breastfeeding experience prior to this delivery?: No  Feeding Mother's Current Feeding Choice: Breast Milk  LATCH Score Latch: Repeated attempts needed to sustain latch, nipple held in mouth throughout feeding, stimulation needed to elicit sucking reflex.  Audible Swallowing: None  Type of Nipple: Everted at rest and after stimulation  Comfort (Breast/Nipple): Soft / non-tender  Hold (Positioning): Assistance needed to correctly position infant at breast and maintain latch.  LATCH Score: 6   Lactation Tools Discussed/Used    Interventions Interventions: Breast feeding basics reviewed;Assisted with latch;Skin to skin;Breast massage;Hand express;Breast compression;Position options;Support pillows;Adjust position;Education  Discharge Pump: Personal;Hands Free (Spectra and wireless)  Consult Status Consult Status: Follow-up Date: 06/19/21 Follow-up type:  In-patient    Horatio Bertz R Kerington Hildebrant 06/18/2021, 3:00 PM

## 2021-06-18 NOTE — Progress Notes (Signed)
Pt with congestion. No cough or fever. MD notified. Orders for Sudafed 60mg  Q 6hrs  PRN and saline nasal spray received.

## 2021-06-18 NOTE — Progress Notes (Signed)
Patient ID: Alexa Hicks, female   DOB: Oct 06, 1990, 31 y.o.   MRN: [id number] Pt got uncomfortable and received epidural  Afeb VSS  FHR had a run of late decelerations right after epidural that resolved with boluses, positioning and BP correction.  Currently with great variability and some small accels with intermittent mild variables.   FSE and IUPC placed to assess if adequate and follow FHR closely  Cervix 70/4-5/-1  Feels OP beginning to rotate to OA  Will follow progress.  Pt placed in exaggerated Sims on her right

## 2021-06-18 NOTE — Anesthesia Postprocedure Evaluation (Signed)
Anesthesia Post Note  Patient: Alexa Hicks  Procedure(s) Performed: AN AD HOC LABOR EPIDURAL     Patient location during evaluation: Mother Baby Anesthesia Type: Epidural Level of consciousness: awake and alert and oriented Pain management: satisfactory to patient Vital Signs Assessment: post-procedure vital signs reviewed and stable Respiratory status: respiratory function stable Cardiovascular status: stable Postop Assessment: no headache, no backache, epidural receding, patient able to bend at knees, no signs of nausea or vomiting, adequate PO intake and able to ambulate Anesthetic complications: no   No notable events documented.  Last Vitals:  Vitals:   06/18/21 0720 06/18/21 1125  BP: (!) 93/58 (!) 93/55  Pulse: 86 94  Resp: 18 16  Temp: 36.7 C 36.8 C  SpO2: 100% 99%    Last Pain:  Vitals:   06/18/21 1125  TempSrc: Oral  PainSc: 0-No pain   Pain Goal: Patients Stated Pain Goal: 0 (06/17/21 2319)                 Walburga Hudman

## 2021-06-19 LAB — CBC
HCT: 33.4 % — ABNORMAL LOW (ref 36.0–46.0)
Hemoglobin: 10.6 g/dL — ABNORMAL LOW (ref 12.0–15.0)
MCH: 28.2 pg (ref 26.0–34.0)
MCHC: 31.7 g/dL (ref 30.0–36.0)
MCV: 88.8 fL (ref 80.0–100.0)
Platelets: 216 10*3/uL (ref 150–400)
RBC: 3.76 MIL/uL — ABNORMAL LOW (ref 3.87–5.11)
RDW: 18.4 % — ABNORMAL HIGH (ref 11.5–15.5)
WBC: 13.5 10*3/uL — ABNORMAL HIGH (ref 4.0–10.5)
nRBC: 0 % (ref 0.0–0.2)

## 2021-06-19 NOTE — Lactation Note (Signed)
This note was copied from a baby's chart. Lactation Consultation Note  Patient Name: Alexa Hicks [id number] Date: 06/19/2021 Reason for consult: Follow-up assessment;Term;Infant weight loss;Breastfeeding assistance (2.73% WL) Age:32 hours  P2, Term, Infant Female, 2.73% WL  LC entered the room and baby was asleep in the bassinet. Mom states that breastfeeding are going well. Mom says that the latch was good and denies any pain. Mom asked about pumping and when she could start. LC spoke with mom about pumping and milk storage guidelines. LC also spoke with mom about engorgement, warning signs, infant I/O, outpatient LC services, and feeding frequency.   LC let mom know that baby may be sleepy today due to circumcision and let mom know that she could pump for stimulation.   Mom and dad state that they have no further questions or concerns.   Current Feeding Plan:  Mom will breastfeed baby 8+ times in 24 hours according to feeding cues.  Mom will call pediatrician or her provider with questions or concerns.  Mom will call outpatient LC for breastfeeding assistance.     Feeding Mother's Current Feeding Choice: Breast Milk  Interventions Interventions: Breast feeding basics reviewed;Education  Discharge Discharge Education: Engorgement and breast care;Warning signs for feeding baby;Outpatient recommendation  Consult Status Consult Status: Complete Date: 06/19/21 Follow-up type: Call as needed    Delene Loll 06/19/2021, 11:06 AM

## 2021-06-19 NOTE — Discharge Summary (Signed)
Postpartum Discharge Summary    Patient Name: Alexa Hicks DOB: 11-Apr-1990 MRN: [id number]  Date of admission: 06/17/2021 Delivery date:06/18/2021  Delivering provider: Paula Compton  Date of discharge: 06/19/2021  Admitting diagnosis: Encounter for induction of labor [Z34.90] NSVD (normal spontaneous vaginal delivery) [O80] Intrauterine pregnancy: [redacted]w[redacted]d    Secondary diagnosis:  Principal Problem:   Encounter for induction of labor Active Problems:   NSVD (normal spontaneous vaginal delivery)  Additional problems: iron deficiency anemia with antental iron infusions x 2    Discharge diagnosis: Term Pregnancy Delivered                                              Post partum procedures: none Augmentation: Pitocin and Cytotec Complications: None  Hospital course: Induction of Labor With Vaginal Delivery   31y.o. yo G2P2002 at 340w2das admitted to the hospital 06/17/2021 for induction of labor.  Indication for induction: Elective.  Patient had an uncomplicated labor course as follows: Membrane Rupture Time/Date: 8:47 PM ,06/18/2021   Delivery Method:Vaginal, Spontaneous  Episiotomy: None  Lacerations:  1st degree  Details of delivery can be found in separate delivery note.  Patient had a routine postpartum course. Patient is discharged home 06/19/21.  Newborn Data: Birth date:06/18/2021  Birth time:4:31 AM  Gender:Female  Living status:Living  Apgars:8 ,9  Weight:3110 g   Magnesium Sulfate received: No BMZ received: No Rhophylac:N/A MMR:N/A T-DaP: see office note Flu: N/A Transfusion:No  Physical exam  Vitals:   06/18/21 0720 06/18/21 1125 06/18/21 2032 06/19/21 0506  BP: (!) 93/58 (!) 93/55 94/60 104/62  Pulse: 86 94 66 66  Resp: '18 16 18 18  ' Temp: 98.1 F (36.7 C) 98.2 F (36.8 C)    TempSrc: Oral Oral    SpO2: 100% 99% 99% 99%  Weight:      Height:       General: alert, cooperative, and no distress Lochia: appropriate Uterine Fundus:  firm Incision: N/A DVT Evaluation: No evidence of DVT seen on physical exam. Labs: Lab Results  Component Value Date   WBC 13.5 (H) 06/19/2021   HGB 10.6 (L) 06/19/2021   HCT 33.4 (L) 06/19/2021   MCV 88.8 06/19/2021   PLT 216 06/19/2021      Latest Ref Rng & Units 11/07/2019    8:45 AM  CMP  Glucose 65 - 99 mg/dL 95   BUN 7 - 25 mg/dL 11   Creatinine 0.50 - 1.10 mg/dL 0.78   Sodium 135 - 146 mmol/L 137   Potassium 3.5 - 5.3 mmol/L 4.3   Chloride 98 - 110 mmol/L 104   CO2 20 - 32 mmol/L 26   Calcium 8.6 - 10.2 mg/dL 9.9    Edinburgh Score:    06/18/2021   11:25 AM  Edinburgh Postnatal Depression Scale Screening Tool  I have been able to laugh and see the funny side of things. 0  I have looked forward with enjoyment to things. 0  I have blamed myself unnecessarily when things went wrong. 2  I have been anxious or worried for no good reason. 1  I have felt scared or panicky for no good reason. 1  Things have been getting on top of me. 0  I have been so unhappy that I have had difficulty sleeping. 0  I have felt sad or miserable. 0  I  have been so unhappy that I have been crying. 0  The thought of harming myself has occurred to me. 0  Edinburgh Postnatal Depression Scale Total 4      After visit meds:  Allergies as of 06/19/2021   No Known Allergies      Medication List     TAKE these medications    prenatal multivitamin Tabs tablet Take 1 tablet by mouth daily at 12 noon.         Discharge home in stable condition Infant Feeding:  see peds note Infant Disposition:home with mother Discharge instruction: per After Visit Summary and Postpartum booklet. Activity: Advance as tolerated. Pelvic rest for 6 weeks.  Diet: routine diet Anticipated Birth Control: Unsure Postpartum Appointment:4 weeks Additional Postpartum F/U: Postpartum Depression checkup Future Appointments:No future appointments. Follow up Visit:  [place],  [facility]. Call in 4 week(s).   Contact information: [address]  [address] [place] [address] [phone]                     06/19/2021 Allyn Kenner, DO

## 2021-06-19 NOTE — Lactation Note (Signed)
This note was copied from a baby's chart. Lactation Consultation Note Mom requested LC to come back and give another tutorial on latching. LC done so. Baby latched well. Answered mom's questions. Baby feeding well.   Patient Name: Alexa Hicks [id number] Date: 06/19/2021 Reason for consult: Mother's request;Term Age:32 hours  Maternal Data    Feeding    LATCH Score Latch: Grasps breast easily, tongue down, lips flanged, rhythmical sucking.  Audible Swallowing: A few with stimulation  Type of Nipple: Everted at rest and after stimulation  Comfort (Breast/Nipple): Soft / non-tender  Hold (Positioning): Assistance needed to correctly position infant at breast and maintain latch.  LATCH Score: 8   Lactation Tools Discussed/Used    Interventions Interventions: Breast feeding basics reviewed;Assisted with latch;Skin to skin;Breast massage;Breast compression;Adjust position;Support pillows;Position options  Discharge    Consult Status Consult Status: Follow-up Date: 06/19/21 Follow-up type: In-patient    Charyl Dancer 06/19/2021, 6:22 AM

## 2021-06-26 ENCOUNTER — Telehealth (HOSPITAL_COMMUNITY): Payer: Self-pay | Admitting: *Deleted

## 2021-07-29 ENCOUNTER — Other Ambulatory Visit: Payer: Self-pay | Admitting: Obstetrics and Gynecology

## 2021-07-29 DIAGNOSIS — E041 Nontoxic single thyroid nodule: Secondary | ICD-10-CM

## 2021-08-07 ENCOUNTER — Ambulatory Visit
Admission: RE | Admit: 2021-08-07 | Discharge: 2021-08-07 | Disposition: A | Discharge status: Home / Self Care | Payer: BC Managed Care – PPO | Source: Ambulatory Visit | Attending: Obstetrics and Gynecology | Admitting: Obstetrics and Gynecology

## 2021-08-07 DIAGNOSIS — E041 Nontoxic single thyroid nodule: Secondary | ICD-10-CM

## 2021-09-11 ENCOUNTER — Encounter: Payer: Self-pay | Admitting: Family Medicine

## 2021-09-11 ENCOUNTER — Ambulatory Visit (INDEPENDENT_AMBULATORY_CARE_PROVIDER_SITE_OTHER): Payer: BC Managed Care – PPO | Admitting: Family Medicine

## 2021-09-11 VITALS — BP 112/72 | HR 84 | Temp 98.4°F | Ht 66.5 in | Wt 144.2 lb

## 2021-09-11 DIAGNOSIS — Z0000 Encounter for general adult medical examination without abnormal findings: Secondary | ICD-10-CM | POA: Diagnosis not present

## 2021-09-11 DIAGNOSIS — Z13220 Encounter for screening for lipoid disorders: Secondary | ICD-10-CM | POA: Diagnosis not present

## 2021-09-11 DIAGNOSIS — J351 Hypertrophy of tonsils: Secondary | ICD-10-CM | POA: Diagnosis not present

## 2021-09-11 LAB — LIPID PANEL
Cholesterol: 156 mg/dL (ref 0–200)
HDL: 47.6 mg/dL (ref >39.00)
LDL Cholesterol: 94 mg/dL (ref 0–99)
NonHDL: 108.28
Total CHOL/HDL Ratio: 3
Triglycerides: 73 mg/dL (ref 0.0–149.0)
VLDL: 14.6 mg/dL (ref 0.0–40.0)

## 2021-09-11 LAB — TSH: TSH: 2.07 u[IU]/mL (ref 0.35–5.50)

## 2021-09-11 LAB — CBC WITH DIFFERENTIAL/PLATELET
Basophils Absolute: 0 10*3/uL (ref 0.0–0.1)
Basophils Relative: 0.7 % (ref 0.0–3.0)
Eosinophils Absolute: 0.1 10*3/uL (ref 0.0–0.7)
Eosinophils Relative: 2.9 % (ref 0.0–5.0)
HCT: 43.7 % (ref 36.0–46.0)
Hemoglobin: 14.4 g/dL (ref 12.0–15.0)
Lymphocytes Relative: 33.4 % (ref 12.0–46.0)
Lymphs Abs: 1.6 10*3/uL (ref 0.7–4.0)
MCHC: 32.9 g/dL (ref 30.0–36.0)
MCV: 88.8 fl (ref 78.0–100.0)
Monocytes Absolute: 0.3 10*3/uL (ref 0.1–1.0)
Monocytes Relative: 6.5 % (ref 3.0–12.0)
Neutro Abs: 2.7 10*3/uL (ref 1.4–7.7)
Neutrophils Relative %: 56.5 % (ref 43.0–77.0)
Platelets: 298 10*3/uL (ref 150.0–400.0)
RBC: 4.92 Mil/uL (ref 3.87–5.11)
RDW: 14.4 % (ref 11.5–15.5)
WBC: 4.8 10*3/uL (ref 4.0–10.5)

## 2021-09-11 LAB — BASIC METABOLIC PANEL
BUN: 13 mg/dL (ref 6–23)
CO2: 26 mEq/L (ref 19–32)
Calcium: 10.2 mg/dL (ref 8.4–10.5)
Chloride: 102 mEq/L (ref 96–112)
Creatinine, Ser: 0.78 mg/dL (ref 0.40–1.20)
GFR: 100.63 mL/min (ref >60.00)
Glucose, Bld: 78 mg/dL (ref 70–99)
Potassium: 3.9 mEq/L (ref 3.5–5.1)
Sodium: 138 mEq/L (ref 135–145)

## 2021-09-11 LAB — POC COVID19 BINAXNOW: SARS Coronavirus 2 Ag: NEGATIVE

## 2021-09-11 LAB — POCT RAPID STREP A (OFFICE): Rapid Strep A Screen: NEGATIVE

## 2021-09-11 LAB — HEMOGLOBIN A1C: Hgb A1c MFr Bld: 5.9 % (ref 4.6–6.5)

## 2021-09-11 LAB — T4, FREE: Free T4: 0.92 ng/dL (ref 0.60–1.60)

## 2021-09-11 NOTE — Progress Notes (Signed)
Subjective:     Sentoria Brent is a 31 y.o. female and is here for a comprehensive physical exam. The patient reports no problems.  Patient had a baby boy 3 months ago, Richard III.  Patient also has a 51 year old daughter.  Patient states she is doing well overall returning to work earlier this week.  Pt mentions having sore throat, fever, chills yesterday.  States symptoms lasted about 24 hours.  Thought symptoms were 2/2 return of menses.  In the past patient had ultrasound for thyroid nodule.  Advised to follow-up yearly as nodules were stable.  Had ultrasound 08/07/2021.  Social History   Socioeconomic History   Marital status: Married    Spouse name: Not on file   Number of children: Not on file   Years of education: Not on file   Highest education level: Not on file  Occupational History   Not on file  Tobacco Use   Smoking status: Never   Smokeless tobacco: Never  Vaping Use   Vaping Use: Never used  Substance and Sexual Activity   Alcohol use: Yes    Alcohol/week: 2.0 standard drinks of alcohol    Types: 2 Standard drinks or equivalent per week   Drug use: No   Sexual activity: Yes  Other Topics Concern   Not on file  Social History Narrative   Not on file   Social Determinants of Health   Financial Resource Strain: Not on file  Food Insecurity: Not on file  Transportation Needs: Not on file  Physical Activity: Not on file  Stress: Not on file  Social Connections: Not on file  Intimate Partner Violence: Not on file   Health Maintenance  Topic Date Due   COVID-19 Vaccine (3 - Pfizer series) 02/07/2020   INFLUENZA VACCINE  Never done   TETANUS/TDAP  03/25/2031   HPV VACCINES  Completed   Hepatitis C Screening  Completed   HIV Screening  Completed   PAP SMEAR-Modifier  Discontinued    The following portions of the patient's history were reviewed and updated as appropriate: allergies, current medications, past family history, past medical history,  past social history, past surgical history, and problem list.  Review of Systems Pertinent items noted in HPI and remainder of comprehensive ROS otherwise negative.   Objective:    BP 112/72 (BP Location: Right Arm, Patient Position: Sitting, Cuff Size: Normal)   Pulse 84   Temp 98.4 F (36.9 C) (Oral)   Ht 5' 6.5" (1.689 m)   Wt 144 lb 3.2 oz (65.4 kg)   SpO2 99%   BMI 22.93 kg/m  General appearance: alert, cooperative, and no distress Head: Normocephalic, without obvious abnormality, atraumatic Eyes: conjunctivae/corneas clear. PERRL, EOM's intact. Fundi benign. Ears: normal TM's and external ear canals both ears Nose: Nares normal. Septum midline. Mucosa normal. No drainage or sinus tenderness. Throat: lips, mucosa, and tongue normal; teeth and gums normal.  Pharynx with erythema,L tonsil enlarged and erythematous>R. Neck: Cervical lymphadenopathy, no carotid bruit, no JVD, supple, symmetrical, trachea midline, and thyroid not enlarged, symmetric, no tenderness/mass/nodules Lungs: clear to auscultation bilaterally Heart: regular rate and rhythm, S1, S2 normal, no murmur, click, rub or gallop Abdomen: soft, non-tender; bowel sounds normal; no masses,  no organomegaly Extremities: extremities normal, atraumatic, no cyanosis or edema Pulses: 2+ and symmetric Skin: Skin color, texture, turgor normal. No rashes or lesions Lymph nodes: Cervical, supraclavicular, and axillary nodes normal. Neurologic: Alert and oriented X 3, normal strength and tone. Normal symmetric reflexes.  Normal coordination and gait    Assessment:    Healthy female exam 3 months postpartum recent viral illness symptoms     Plan:    Anticipatory guidance given including wearing seatbelts, smoke detectors in the home, increasing physical activity, increasing p.o. intake of water and vegetables. -Labs -Immunizations reviewed -Pap with OB/GYN -Mammogram and colonoscopy not yet indicated 2/2 age -Thyroid  ultrasound from 08/07/2021 reviewed.  Heterogeneous thyroid tissue suggesting medical thyroid disease.  Significant decrease in size of all nodules, none of which now meet criteria for surveillance. -Given handout -Next CPE in 1 year See After Visit Summary for Counseling Recommendations   Well adult exam - Plan: CBC with Differential/Platelet, Basic metabolic panel, TSH, T4, Free, Hemoglobin A1c, Lipid panel  Enlarged tonsils -Strep and COVID testing negative. -Discussed supportive care as likely symptoms 2/2 viral illness.  - Plan: POC Rapid Strep A, POC COVID-19  Screening for cholesterol level - Plan: Lipid panel  Follow-up as needed  Abbe Amsterdam, MD

## 2022-07-30 DIAGNOSIS — Z130 Encounter for screening for diseases of the blood and blood-forming organs and certain disorders involving the immune mechanism: Secondary | ICD-10-CM | POA: Diagnosis not present

## 2022-07-30 DIAGNOSIS — Z01419 Encounter for gynecological examination (general) (routine) without abnormal findings: Secondary | ICD-10-CM | POA: Diagnosis not present

## 2022-07-30 DIAGNOSIS — Z1389 Encounter for screening for other disorder: Secondary | ICD-10-CM | POA: Diagnosis not present

## 2022-07-30 DIAGNOSIS — Z3046 Encounter for surveillance of implantable subdermal contraceptive: Secondary | ICD-10-CM | POA: Diagnosis not present

## 2022-08-03 DIAGNOSIS — R82998 Other abnormal findings in urine: Secondary | ICD-10-CM | POA: Diagnosis not present

## 2022-10-22 ENCOUNTER — Encounter: Payer: Self-pay | Admitting: Family Medicine

## 2022-10-22 ENCOUNTER — Ambulatory Visit: Payer: 59 | Admitting: Family Medicine

## 2022-10-22 VITALS — BP 126/92 | HR 72 | Temp 98.0°F | Ht 67.0 in | Wt 146.0 lb

## 2022-10-22 DIAGNOSIS — Z711 Person with feared health complaint in whom no diagnosis is made: Secondary | ICD-10-CM | POA: Diagnosis not present

## 2022-10-22 DIAGNOSIS — Z0000 Encounter for general adult medical examination without abnormal findings: Secondary | ICD-10-CM | POA: Diagnosis not present

## 2022-10-22 DIAGNOSIS — Z975 Presence of (intrauterine) contraceptive device: Secondary | ICD-10-CM | POA: Diagnosis not present

## 2022-10-22 DIAGNOSIS — M25511 Pain in right shoulder: Secondary | ICD-10-CM | POA: Diagnosis not present

## 2022-10-22 DIAGNOSIS — R030 Elevated blood-pressure reading, without diagnosis of hypertension: Secondary | ICD-10-CM | POA: Diagnosis not present

## 2022-10-22 DIAGNOSIS — M25819 Other specified joint disorders, unspecified shoulder: Secondary | ICD-10-CM | POA: Diagnosis not present

## 2022-10-22 LAB — CBC WITH DIFFERENTIAL/PLATELET
Basophils Absolute: 0.1 10*3/uL (ref 0.0–0.1)
Basophils Relative: 0.9 % (ref 0.0–3.0)
Eosinophils Absolute: 0 10*3/uL (ref 0.0–0.7)
Eosinophils Relative: 0.8 % (ref 0.0–5.0)
HCT: 42.2 % (ref 36.0–46.0)
Hemoglobin: 13.6 g/dL (ref 12.0–15.0)
Lymphocytes Relative: 38.6 % (ref 12.0–46.0)
Lymphs Abs: 2.4 10*3/uL (ref 0.7–4.0)
MCHC: 32.1 g/dL (ref 30.0–36.0)
MCV: 88.6 fL (ref 78.0–100.0)
Monocytes Absolute: 0.4 10*3/uL (ref 0.1–1.0)
Monocytes Relative: 7 % (ref 3.0–12.0)
Neutro Abs: 3.2 10*3/uL (ref 1.4–7.7)
Neutrophils Relative %: 52.7 % (ref 43.0–77.0)
Platelets: 324 10*3/uL (ref 150.0–400.0)
RBC: 4.77 Mil/uL (ref 3.87–5.11)
RDW: 12.5 % (ref 11.5–15.5)
WBC: 6.2 10*3/uL (ref 4.0–10.5)

## 2022-10-22 LAB — COMPREHENSIVE METABOLIC PANEL
ALT: 9 U/L (ref 0–35)
AST: 11 U/L (ref 0–37)
Albumin: 4.6 g/dL (ref 3.5–5.2)
Alkaline Phosphatase: 66 U/L (ref 39–117)
BUN: 11 mg/dL (ref 6–23)
CO2: 27 meq/L (ref 19–32)
Calcium: 9.8 mg/dL (ref 8.4–10.5)
Chloride: 102 meq/L (ref 96–112)
Creatinine, Ser: 0.82 mg/dL (ref 0.40–1.20)
GFR: 94.04 mL/min (ref >60.00)
Glucose, Bld: 100 mg/dL — ABNORMAL HIGH (ref 70–99)
Potassium: 3.9 meq/L (ref 3.5–5.1)
Sodium: 137 meq/L (ref 135–145)
Total Bilirubin: 0.4 mg/dL (ref 0.2–1.2)
Total Protein: 7.9 g/dL (ref 6.0–8.3)

## 2022-10-22 LAB — POCT URINALYSIS DIPSTICK
Bilirubin, UA: NEGATIVE
Blood, UA: NEGATIVE
Glucose, UA: NEGATIVE
Ketones, UA: NEGATIVE
Leukocytes, UA: NEGATIVE
Nitrite, UA: NEGATIVE
Protein, UA: NEGATIVE
Spec Grav, UA: 1.015 (ref 1.010–1.025)
Urobilinogen, UA: 0.2 U/dL
pH, UA: 6 (ref 5.0–8.0)

## 2022-10-22 LAB — LIPID PANEL
Cholesterol: 127 mg/dL (ref 0–200)
HDL: 49.1 mg/dL (ref >39.00)
LDL Cholesterol: 70 mg/dL (ref 0–99)
NonHDL: 78.27
Total CHOL/HDL Ratio: 3
Triglycerides: 39 mg/dL (ref 0.0–149.0)
VLDL: 7.8 mg/dL (ref 0.0–40.0)

## 2022-10-22 LAB — TSH: TSH: 1.82 u[IU]/mL (ref 0.35–5.50)

## 2022-10-22 LAB — HEMOGLOBIN A1C: Hgb A1c MFr Bld: 5.5 % (ref 4.6–6.5)

## 2022-10-22 NOTE — Progress Notes (Signed)
Established Patient Office Visit   Subjective  Patient ID: Alexa Hicks, female    DOB: 1990/02/19  Age: 32 y.o. MRN: [id number]  Chief Complaint  Patient presents with   Annual Exam    Patient is a 32 year old female seen for CPE.  Patient states she has been doing well.  Inquires if she may still have a UTI.  States was told at OB/GYN appointment last month.  Completed a round of antibiotics.  Was not having symptoms at that time nor does she had any now.    Patient mentions posterior neck right shoulder pain with moving arm posteriorly.  Denies symptoms with overhead lifting.  Has difficulty describing sensation.  Patient is a side sleeper.  Inquires if tearing her baby is also contributing to symptoms.  Patient has not tried anything for sensation.  Patient had recent follow-up with OB/GYN.  Inquires about thyroid check due to heavy breakthrough bleeding on Nexplanon.  Patient has had implant x 1 year.  Thinking about having implant removed versus addition of OCPs to help regulate bleeding.    BP elevation: Patient endorses eating mostly fast food-[employer], [employer], [employer] due to convenience.    Patient Active Problem List   Diagnosis Date Noted   NSVD (normal spontaneous vaginal delivery) 06/18/2021   Encounter for induction of labor 06/17/2021   Chronic iron deficiency anemia 05/31/2021   Pelvic inflammatory disease (PID) 08/28/2019   H/O seasonal allergies 09/14/2017   Nexplanon in place 09/14/2017   Past Medical History:  Diagnosis Date   Frequent headaches    Hay fever    Past Surgical History:  Procedure Laterality Date   NO PAST SURGERIES     Social History   Tobacco Use   Smoking status: Never   Smokeless tobacco: Never  Vaping Use   Vaping status: Never Used  Substance Use Topics   Alcohol use: Yes    Alcohol/week: 2.0 standard drinks of alcohol    Types: 2 Standard drinks or equivalent per week   Drug use: No   Family History   Problem Relation Age of Onset   Cancer Maternal Grandfather    Early death Maternal Grandfather    Arthritis Maternal Grandmother    Diabetes Maternal Grandmother    Stroke Maternal Grandmother    Early death Paternal Grandmother    Early death Paternal Grandfather    No Known Allergies    ROS Negative unless stated above    Objective:     BP (!) 126/92 (BP Location: Left Arm, Cuff Size: Normal)   Pulse 72   Temp 98 F (36.7 C) (Oral)   Ht 5\' 7"  (1.702 m)   Wt 146 lb (66.2 kg)   LMP 09/22/2022 (Approximate)   SpO2 94%   Breastfeeding No   BMI 22.87 kg/m  BP Readings from Last 3 Encounters:  10/22/22 (!) 126/92  09/11/21 112/72  06/19/21 104/62   Wt Readings from Last 3 Encounters:  10/22/22 146 lb (66.2 kg)  09/11/21 144 lb 3.2 oz (65.4 kg)  06/17/21 159 lb 14.4 oz (72.5 kg)      Physical Exam Constitutional:      Appearance: Normal appearance.  HENT:     Head: Normocephalic and atraumatic.     Right Ear: Tympanic membrane, ear canal and external ear normal.     Left Ear: Tympanic membrane, ear canal and external ear normal.     Nose: Nose normal.     Mouth/Throat:     Mouth:  Mucous membranes are moist.     Pharynx: No oropharyngeal exudate or posterior oropharyngeal erythema.  Eyes:     General: No scleral icterus.    Extraocular Movements: Extraocular movements intact.     Conjunctiva/sclera: Conjunctivae normal.     Pupils: Pupils are equal, round, and reactive to light.  Neck:     Thyroid: No thyromegaly.  Cardiovascular:     Rate and Rhythm: Normal rate and regular rhythm.     Pulses: Normal pulses.     Heart sounds: Normal heart sounds. No murmur heard.    No friction rub.  Pulmonary:     Effort: Pulmonary effort is normal.     Breath sounds: Normal breath sounds. No wheezing, rhonchi or rales.  Abdominal:     General: Bowel sounds are normal.     Palpations: Abdomen is soft.     Tenderness: There is no abdominal tenderness.   Musculoskeletal:        General: No deformity. Normal range of motion.     Right shoulder: Bony tenderness present.     Left shoulder: Normal.       Arms:     Comments: Posterior right shoulder with point tenderness.  Lymphadenopathy:     Cervical: No cervical adenopathy.  Skin:    General: Skin is warm and dry.     Findings: No lesion.  Neurological:     General: No focal deficit present.     Mental Status: She is alert and oriented to person, place, and time.  Psychiatric:        Mood and Affect: Mood normal.        Thought Content: Thought content normal.      Results for orders placed or performed in visit on 10/22/22  POCT urinalysis dipstick  Result Value Ref Range   Color, UA yellow    Clarity, UA Clear    Glucose, UA Negative Negative   Bilirubin, UA Negative    Ketones, UA Negative    Spec Grav, UA 1.015 1.010 - 1.025   Blood, UA Negative    pH, UA 6.0 5.0 - 8.0   Protein, UA Negative Negative   Urobilinogen, UA 0.2 0.2 or 1.0 E.U./dL   Nitrite, UA Negative    Leukocytes, UA Negative Negative   Appearance     Odor        Assessment & Plan:  Well adult exam -     CBC with Differential/Platelet -     Comprehensive metabolic panel -     Lipid panel -     TSH -     Hemoglobin A1c -     POCT urinalysis dipstick -     T4, free  Concern about urinary tract disease without diagnosis -     POCT urinalysis dipstick  Acute pain of right shoulder -     CBC with Differential/Platelet  Impingement of shoulder  Elevated blood pressure reading -     Comprehensive metabolic panel -     TSH -     T4, free  Nexplanon in place  Age-appropriate health screenings discussed.  Will obtain labs.  Pap done with OB/GYN.  Immunizations reviewed.  POC UA negative.  Discussed supportive care Tylenol or NSAIDs, heat, ice, massage, topical analgesics, stretching, etc. for right shoulder pain likely 2/2 impingement.  Consider adjusting sleep position.  BP elevation after  recheck.  Lifestyle modifications such as decreasing fast food intake.  Follow-up in 4-6 weeks for BP recheck.  Return if symptoms worsen or fail to improve.   Deeann Saint, MD

## 2022-11-12 ENCOUNTER — Ambulatory Visit: Payer: 59 | Admitting: Family Medicine

## 2023-03-07 DIAGNOSIS — Z113 Encounter for screening for infections with a predominantly sexual mode of transmission: Secondary | ICD-10-CM | POA: Diagnosis not present

## 2023-03-07 DIAGNOSIS — R102 Pelvic and perineal pain: Secondary | ICD-10-CM | POA: Diagnosis not present

## 2023-03-07 DIAGNOSIS — R300 Dysuria: Secondary | ICD-10-CM | POA: Diagnosis not present

## 2023-03-07 DIAGNOSIS — N898 Other specified noninflammatory disorders of vagina: Secondary | ICD-10-CM | POA: Diagnosis not present

## 2023-04-15 ENCOUNTER — Ambulatory Visit: Admitting: Family Medicine

## 2023-04-15 ENCOUNTER — Encounter: Payer: Self-pay | Admitting: Family Medicine

## 2023-04-15 VITALS — BP 123/80 | HR 71 | Temp 98.2°F | Wt 135.6 lb

## 2023-04-15 DIAGNOSIS — F419 Anxiety disorder, unspecified: Secondary | ICD-10-CM

## 2023-04-15 DIAGNOSIS — R030 Elevated blood-pressure reading, without diagnosis of hypertension: Secondary | ICD-10-CM

## 2023-04-15 NOTE — Progress Notes (Signed)
 Established Patient Office Visit   Subjective  Patient ID: Alexa Hicks, female    DOB: 07/10/1990  Age: 33 y.o. MRN: [id number]  Chief Complaint  Patient presents with   Hypertension    Patient is a 33 year old female seen for follow-up on elevation in BP.  Patient states BP level at home.  Can be 140/92 or 127/79.  Patient eating fast food most days per week.  May forget to eat lunch during the day.  Typically does not eat breakfast.  Not drinking much water.  Patient notes increased stress dealing with her kids, work, and her husband who has MS.  Patient states she did not sleep well last night.    Patient Active Problem List   Diagnosis Date Noted   NSVD (normal spontaneous vaginal delivery) 06/18/2021   Encounter for induction of labor 06/17/2021   Chronic iron deficiency anemia 05/31/2021   Pelvic inflammatory disease (PID) 08/28/2019   H/O seasonal allergies 09/14/2017   Nexplanon in place 09/14/2017   Past Medical History:  Diagnosis Date   Frequent headaches    Hay fever    Past Surgical History:  Procedure Laterality Date   NO PAST SURGERIES     Social History   Tobacco Use   Smoking status: Never   Smokeless tobacco: Never  Vaping Use   Vaping status: Never Used  Substance Use Topics   Alcohol use: Yes    Alcohol/week: 2.0 standard drinks of alcohol    Types: 2 Standard drinks or equivalent per week   Drug use: No   Family History  Problem Relation Age of Onset   Cancer Maternal Grandfather    Early death Maternal Grandfather    Arthritis Maternal Grandmother    Diabetes Maternal Grandmother    Stroke Maternal Grandmother    Early death Paternal Grandmother    Early death Paternal Grandfather    No Known Allergies    ROS Negative unless stated above    Objective:     BP 123/80 (BP Location: Right Arm, Patient Position: Sitting, Cuff Size: Normal)   Pulse 71   Temp 98.2 F (36.8 C) (Oral)   Wt 135 lb 9.6 oz (61.5 kg)   LMP  02/25/2023   SpO2 95%   BMI 21.24 kg/m  BP Readings from Last 3 Encounters:  04/15/23 123/80  10/22/22 (!) 126/92  09/11/21 112/72   Wt Readings from Last 3 Encounters:  04/15/23 135 lb 9.6 oz (61.5 kg)  10/22/22 146 lb (66.2 kg)  09/11/21 144 lb 3.2 oz (65.4 kg)      Physical Exam Constitutional:      General: She is not in acute distress.    Appearance: Normal appearance.  HENT:     Head: Normocephalic and atraumatic.     Nose: Nose normal.     Mouth/Throat:     Mouth: Mucous membranes are moist.  Cardiovascular:     Rate and Rhythm: Normal rate and regular rhythm.     Heart sounds: Normal heart sounds. No murmur heard.    No gallop.  Pulmonary:     Effort: Pulmonary effort is normal. No respiratory distress.     Breath sounds: Normal breath sounds. No wheezing, rhonchi or rales.  Skin:    General: Skin is warm and dry.  Neurological:     Mental Status: She is alert and oriented to person, place, and time.       04/15/2023   10:49 AM 10/22/2022    8:17 AM  09/11/2021    8:35 AM  Depression screen PHQ 2/9  Decreased Interest 0 0 0  Down, Depressed, Hopeless 0 0 1  PHQ - 2 Score 0 0 1  Altered sleeping 0  1  Tired, decreased energy 0  1  Change in appetite 0  0  Feeling bad or failure about yourself  0  0  Trouble concentrating 0  0  Moving slowly or fidgety/restless 0  0  Suicidal thoughts 0  0  PHQ-9 Score 0  3  Difficult doing work/chores   Not difficult at all      04/15/2023   10:49 AM  GAD 7 : Generalized Anxiety Score  Nervous, Anxious, on Edge 1  Control/stop worrying 0  Worry too much - different things 1  Trouble relaxing 0  Restless 0  Easily annoyed or irritable 0  Afraid - awful might happen 0  Total GAD 7 Score 2  Anxiety Difficulty Not difficult at all     No results found for any visits on 04/15/23.    Assessment & Plan:  Elevated blood pressure reading  Anxiety  Initial BP reading elevated, asymptomatic.  Recheck normal.   Patient advised to decrease sodium intake by cooking at home and avoiding fast food.  Patient will also increase intake of water and vegetables.  Will continue monitoring BP at home and bring log in to next appointment.  Patient advised if readings consistently greater than 140/90 to notify clinic prior to appointment.  Anxiety may also be contributing to BP.  Pt to continue counseling. Self care encouraged.  Return in about 2 months (around 06/15/2023).   Deeann Saint, MD

## 2023-06-17 ENCOUNTER — Ambulatory Visit: Admitting: Family Medicine

## 2023-06-24 ENCOUNTER — Encounter: Payer: Self-pay | Admitting: Family Medicine

## 2023-06-24 ENCOUNTER — Ambulatory Visit: Admitting: Family Medicine

## 2023-06-24 VITALS — BP 118/74 | Temp 98.3°F | Ht 67.0 in | Wt 133.0 lb

## 2023-06-24 DIAGNOSIS — F419 Anxiety disorder, unspecified: Secondary | ICD-10-CM | POA: Diagnosis not present

## 2023-06-24 DIAGNOSIS — R030 Elevated blood-pressure reading, without diagnosis of hypertension: Secondary | ICD-10-CM | POA: Diagnosis not present

## 2023-06-24 NOTE — Progress Notes (Signed)
 Established Patient Office Visit   Subjective  Patient ID: Alexa Hicks, female    DOB: 1990-04-12  Age: 33 y.o. MRN: [id number]  Chief Complaint  Patient presents with   Follow-up    Follow up on BP; still been kind of elevated     Pt is a 33 yo female seen for f/u on elevated bp readings.  Pt started checking bp at home.  Readings 120s-138/79-90.  The highest reading was on pt's birthday when she ate out/had EtOH.  Patient no longer eating fast food and drinking more water.  Pt has increased anxiety when checking bp.      Patient Active Problem List   Diagnosis Date Noted   NSVD (normal spontaneous vaginal delivery) 06/18/2021   Encounter for induction of labor 06/17/2021   Chronic iron deficiency anemia 05/31/2021   Pelvic inflammatory disease (PID) 08/28/2019   H/O seasonal allergies 09/14/2017   Nexplanon in place 09/14/2017   Past Medical History:  Diagnosis Date   Frequent headaches    Hay fever    Past Surgical History:  Procedure Laterality Date   NO PAST SURGERIES     Social History   Tobacco Use   Smoking status: Never   Smokeless tobacco: Never  Vaping Use   Vaping status: Never Used  Substance Use Topics   Alcohol use: Yes    Alcohol/week: 2.0 standard drinks of alcohol    Types: 2 Standard drinks or equivalent per week   Drug use: No   Family History  Problem Relation Age of Onset   Cancer Maternal Grandfather    Early death Maternal Grandfather    Arthritis Maternal Grandmother    Diabetes Maternal Grandmother    Stroke Maternal Grandmother    Early death Paternal Grandmother    Early death Paternal Grandfather    No Known Allergies  ROS Negative unless stated above    Objective:     BP 118/74 (BP Location: Left Arm, Patient Position: Sitting, Cuff Size: Normal)   Temp 98.3 F (36.8 C)   Ht 5' 7 (1.702 m)   Wt 133 lb (60.3 kg)   BMI 20.83 kg/m  BP Readings from Last 3 Encounters:  06/24/23 118/74  04/15/23 123/80   10/22/22 (!) 126/92   Wt Readings from Last 3 Encounters:  06/24/23 133 lb (60.3 kg)  04/15/23 135 lb 9.6 oz (61.5 kg)  10/22/22 146 lb (66.2 kg)      Physical Exam Constitutional:      General: She is not in acute distress.    Appearance: Normal appearance.  HENT:     Head: Normocephalic and atraumatic.     Nose: Nose normal.     Mouth/Throat:     Mouth: Mucous membranes are moist.   Cardiovascular:     Rate and Rhythm: Normal rate and regular rhythm.     Heart sounds: Normal heart sounds. No murmur heard.    No gallop.  Pulmonary:     Effort: Pulmonary effort is normal. No respiratory distress.     Breath sounds: Normal breath sounds. No wheezing, rhonchi or rales.   Skin:    General: Skin is warm and dry.   Neurological:     Mental Status: She is alert and oriented to person, place, and time.        04/15/2023   10:59 AM 04/15/2023   10:49 AM 10/22/2022    8:17 AM  Depression screen PHQ 2/9  Decreased Interest 0 0 0  Down,  Depressed, Hopeless 0 0 0  PHQ - 2 Score 0 0 0  Altered sleeping 0 0   Tired, decreased energy 0 0   Change in appetite 0 0   Feeling bad or failure about yourself  0 0   Trouble concentrating 0 0   Moving slowly or fidgety/restless 0 0   Suicidal thoughts 0 0   PHQ-9 Score 0 0       04/15/2023   10:59 AM 04/15/2023   10:49 AM  GAD 7 : Generalized Anxiety Score  Nervous, Anxious, on Edge 1 1  Control/stop worrying 0 0  Worry too much - different things 1 1  Trouble relaxing 0 0  Restless 0 0  Easily annoyed or irritable 0 0  Afraid - awful might happen 0 0  Total GAD 7 Score 2 2  Anxiety Difficulty Not difficult at all Not difficult at all     No results found for any visits on 06/24/23.    Assessment & Plan:   Elevated blood pressure reading  Anxiety  Seen for follow-up on BP.  Readings now improved/controlled.  Patient's anxiety contributing to elevated numbers.  BP initially 138/82.  Recheck 118/74 by this provider.   Discussed self-care and ways to decrease stress/anxiety.  Continue lifestyle modifications and monitoring BP at home.  For elevations consistently greater than 140/90 notify clinic.  Return if symptoms worsen or fail to improve.   Viola Greulich, MD

## 2023-07-23 IMAGING — US US THYROID
1 series · 13 of 25 positions shown · non-contrast
Comparison: None.

CLINICAL DATA: Goiter.

EXAM:
THYROID ULTRASOUND
TECHNIQUE: Ultrasound examination of the thyroid gland and adjacent soft
tissues was performed.

[Series 1: us thyroid · 0.07mm/px · 13 of 44 slices shown]
[im 1/44]
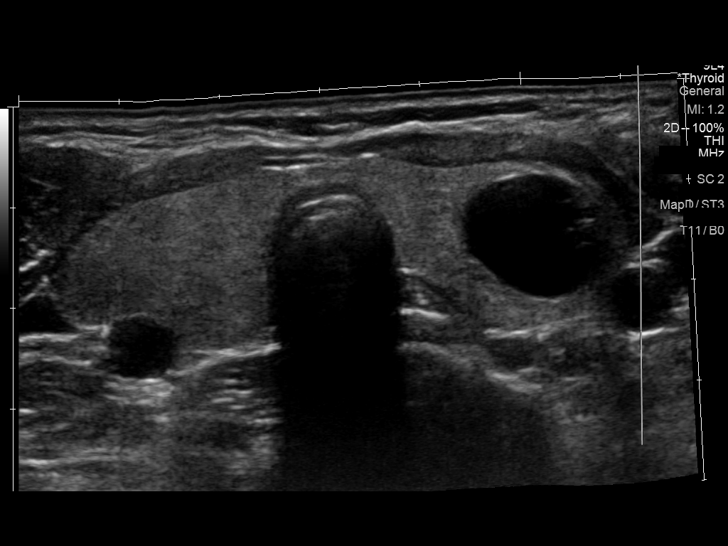
[im 4/44]
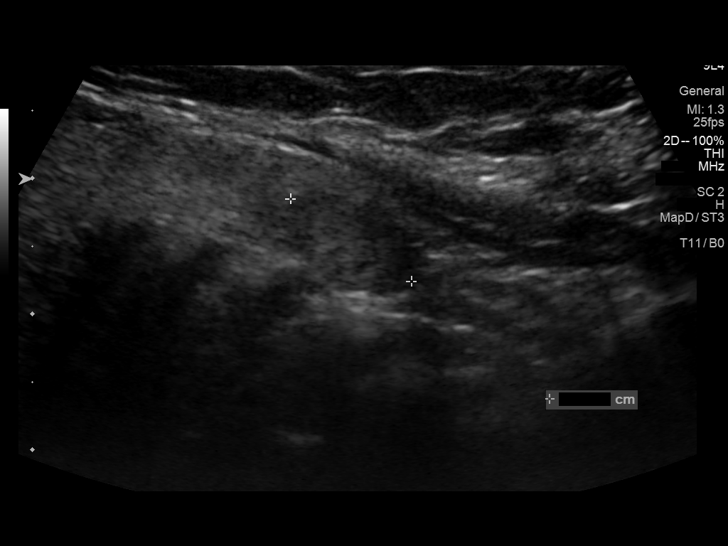
[im 8/44]
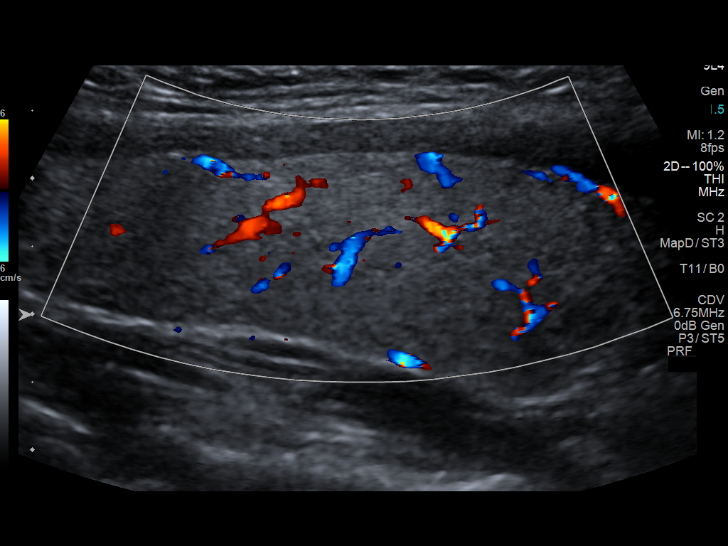
[im 11/44]
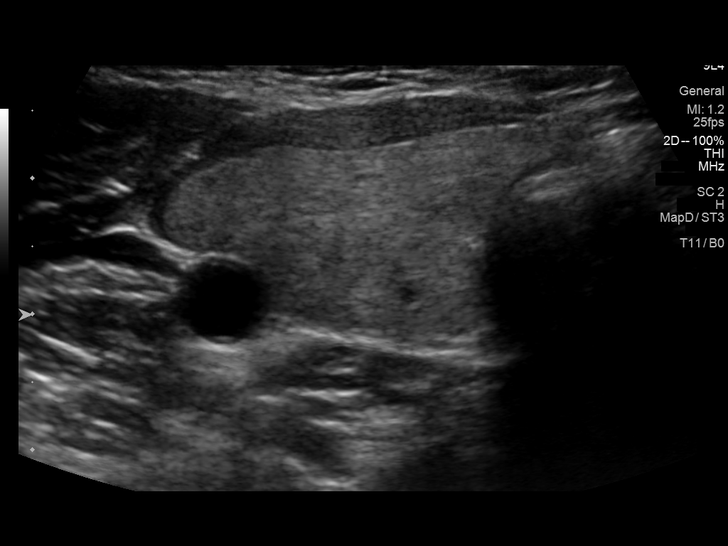
[im 15/44]
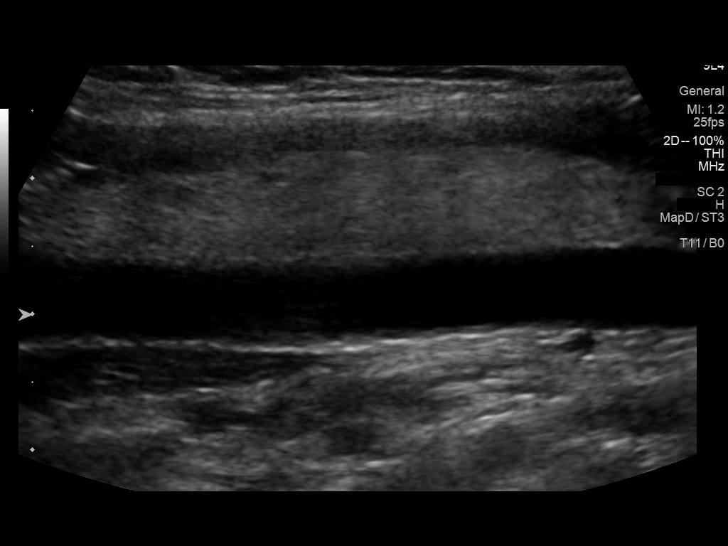
[im 18/44]
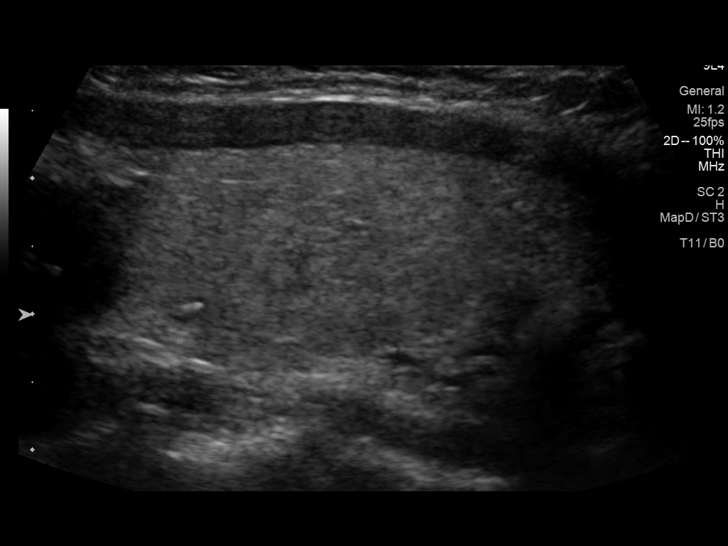
[im 22/44]
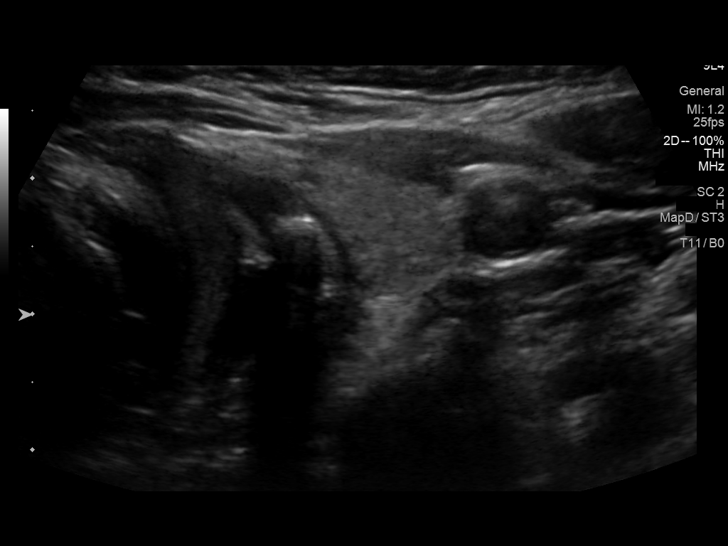
[im 26/44]
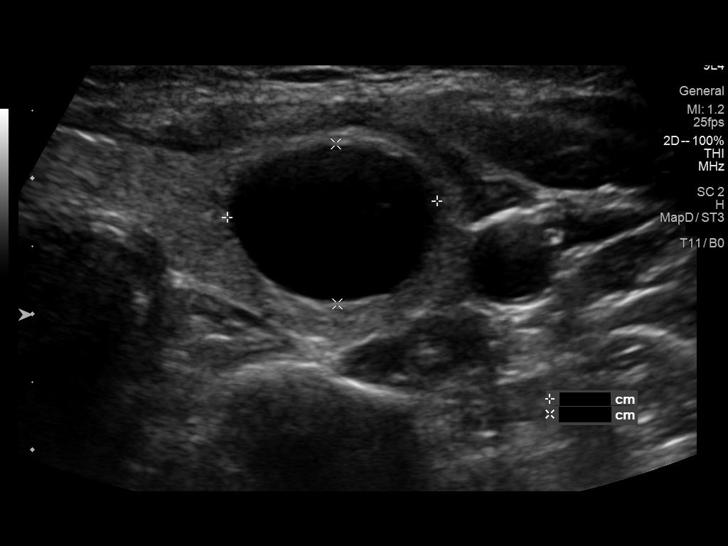
[im 29/44]
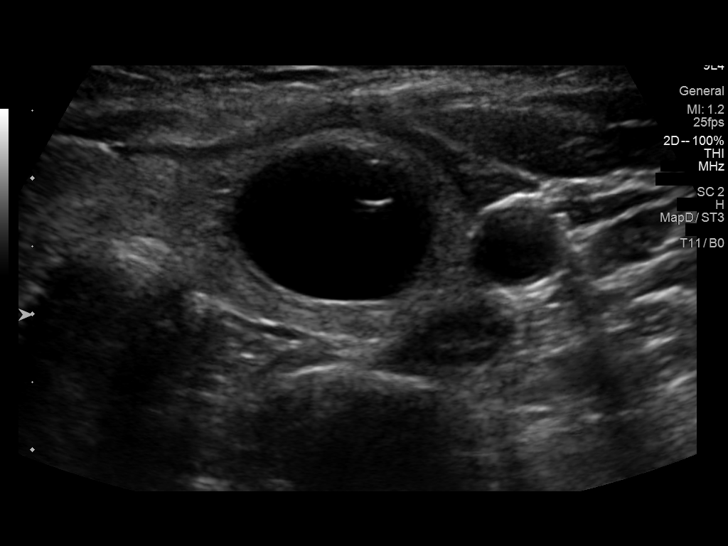
[im 33/44]
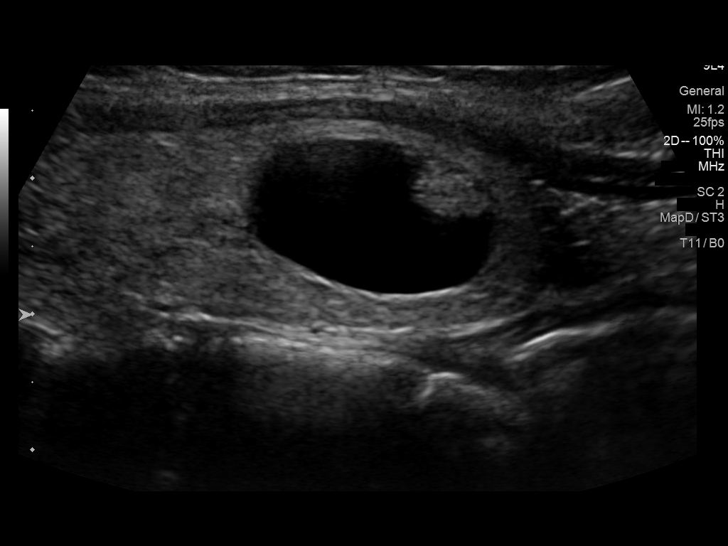
[im 36/44]
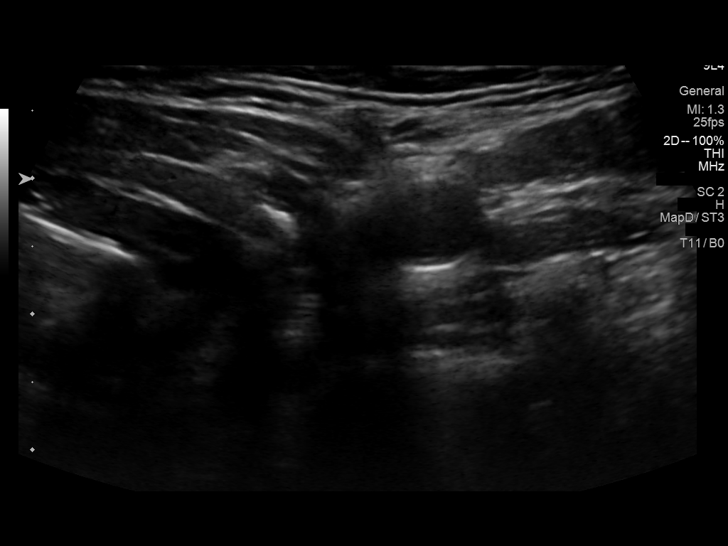
[im 40/44]
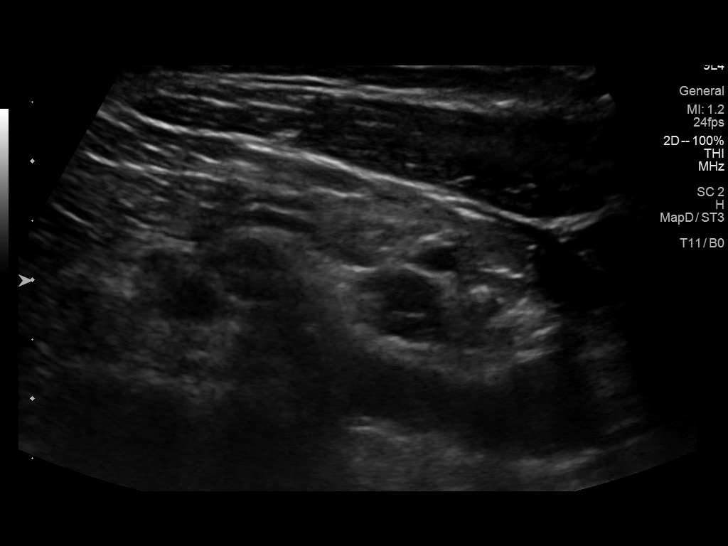
[im 44/44]
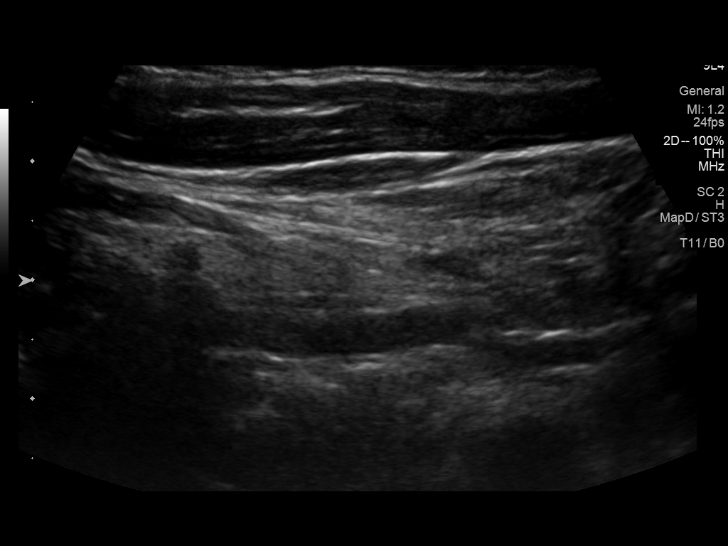

[13 of 25 positions shown; findings below may reference images not displayed]

FINDINGS: Parenchymal Echotexture: Mildly heterogenous

Isthmus: 0.6 cm

Right lobe: 5.5 x 1.5 x 2.4 cm

Left lobe: 5.5 x 1.5 x 2.3 cm

_________________________________________________________

Estimated total number of nodules >/= 1 cm: 2

Number of spongiform nodules >/=  2 cm not described below (TR1): 0

Number of mixed cystic and solid nodules >/= 1.5 cm not described
below (TR2): 0

_________________________________________________________

Nodule # 1:

Location: Isthmus; Inferior

Maximum size: 1.1 cm; Other 2 dimensions: 0.8 x 0.8 cm

Composition: solid/almost completely solid (2)

Echogenicity: hypoechoic (2)

Shape: not taller-than-wide (0)

Margins: ill-defined (0)

Echogenic foci: none (0)

ACR TI-RADS total points: 4.

ACR TI-RADS risk category: TR4 (4-6 points).

ACR TI-RADS recommendations:

*Given size (>/= 1 - 1.4 cm) and appearance, a follow-up ultrasound
in 1 year should be considered based on TI-RADS criteria.

_________________________________________________________

Nodule # 2: Small subcentimeter isoechoic solid nodule noted
incidentally in the right inferior gland. No further follow-up
recommended.

Nodule # 3: Approximately 2 cm minimally complex predominantly
cystic nodule in the left inferior gland. This is considered TI-RADS
category 2, low risk and does not warrant further evaluation.
IMPRESSION: 1. Small 1.1 cm TI-RADS category 4 nodule along the inferior aspect
of the thyroid isthmus meets criteria for imaging surveillance.
Recommend follow-up ultrasound in 1 year.
2. Additional ancillary findings as above which do not meet criteria
for further evaluation.

The above is in keeping with the ACR TI-RADS recommendations - [HOSPITAL] 8238;[DATE].

## 2023-08-03 DIAGNOSIS — Z01419 Encounter for gynecological examination (general) (routine) without abnormal findings: Secondary | ICD-10-CM | POA: Diagnosis not present

## 2023-08-03 DIAGNOSIS — Z130 Encounter for screening for diseases of the blood and blood-forming organs and certain disorders involving the immune mechanism: Secondary | ICD-10-CM | POA: Diagnosis not present

## 2023-08-03 DIAGNOSIS — Z1151 Encounter for screening for human papillomavirus (HPV): Secondary | ICD-10-CM | POA: Diagnosis not present

## 2023-08-03 DIAGNOSIS — N898 Other specified noninflammatory disorders of vagina: Secondary | ICD-10-CM | POA: Diagnosis not present

## 2023-08-03 DIAGNOSIS — Z1389 Encounter for screening for other disorder: Secondary | ICD-10-CM | POA: Diagnosis not present

## 2023-08-03 DIAGNOSIS — Z124 Encounter for screening for malignant neoplasm of cervix: Secondary | ICD-10-CM | POA: Diagnosis not present

## 2023-08-09 LAB — HM PAP SMEAR: HPV, high-risk: NEGATIVE

## 2023-08-18 ENCOUNTER — Encounter: Payer: Self-pay | Admitting: Family Medicine

## 2023-08-19 ENCOUNTER — Telehealth: Payer: Self-pay

## 2023-08-19 NOTE — Telephone Encounter (Signed)
 Copied from CRM #[id number]. Topic: General - Other >> Aug 19, 2023 11:43 AM Ismael A wrote: Reason for CRM: patient called in returning call from [facility]

## 2023-10-28 ENCOUNTER — Ambulatory Visit: Admitting: Family Medicine

## 2023-10-28 ENCOUNTER — Encounter: Payer: Self-pay | Admitting: Family Medicine

## 2023-10-28 VITALS — BP 122/80 | HR 91 | Temp 97.6°F | Ht 67.0 in | Wt 137.6 lb

## 2023-10-28 DIAGNOSIS — Z0000 Encounter for general adult medical examination without abnormal findings: Secondary | ICD-10-CM

## 2023-10-28 DIAGNOSIS — R350 Frequency of micturition: Secondary | ICD-10-CM | POA: Diagnosis not present

## 2023-10-28 LAB — CBC WITH DIFFERENTIAL/PLATELET
Basophils Absolute: 0.1 K/uL (ref 0.0–0.1)
Basophils Relative: 0.8 % (ref 0.0–3.0)
Eosinophils Absolute: 0 K/uL (ref 0.0–0.7)
Eosinophils Relative: 0.3 % (ref 0.0–5.0)
HCT: 34.5 % — ABNORMAL LOW (ref 36.0–46.0)
Hemoglobin: 10.7 g/dL — ABNORMAL LOW (ref 12.0–15.0)
Lymphocytes Relative: 31.2 % (ref 12.0–46.0)
Lymphs Abs: 2.3 K/uL (ref 0.7–4.0)
MCHC: 31.1 g/dL (ref 30.0–36.0)
MCV: 75.2 fl — ABNORMAL LOW (ref 78.0–100.0)
Monocytes Absolute: 0.5 K/uL (ref 0.1–1.0)
Monocytes Relative: 7.1 % (ref 3.0–12.0)
Neutro Abs: 4.4 K/uL (ref 1.4–7.7)
Neutrophils Relative %: 60.6 % (ref 43.0–77.0)
Platelets: 347 K/uL (ref 150.0–400.0)
RBC: 4.59 Mil/uL (ref 3.87–5.11)
RDW: 16 % — ABNORMAL HIGH (ref 11.5–15.5)
WBC: 7.3 K/uL (ref 4.0–10.5)

## 2023-10-28 LAB — LIPID PANEL
Cholesterol: 121 mg/dL (ref 0–200)
HDL: 55.6 mg/dL (ref >39.00)
LDL Cholesterol: 59 mg/dL (ref 0–99)
NonHDL: 65.7
Total CHOL/HDL Ratio: 2
Triglycerides: 36 mg/dL (ref 0.0–149.0)
VLDL: 7.2 mg/dL (ref 0.0–40.0)

## 2023-10-28 LAB — COMPREHENSIVE METABOLIC PANEL WITH GFR
ALT: 11 U/L (ref 0–35)
AST: 14 U/L (ref 0–37)
Albumin: 4.5 g/dL (ref 3.5–5.2)
Alkaline Phosphatase: 53 U/L (ref 39–117)
BUN: 11 mg/dL (ref 6–23)
CO2: 23 meq/L (ref 19–32)
Calcium: 9.6 mg/dL (ref 8.4–10.5)
Chloride: 102 meq/L (ref 96–112)
Creatinine, Ser: 0.7 mg/dL (ref 0.40–1.20)
GFR: 112.89 mL/min (ref >60.00)
Glucose, Bld: 76 mg/dL (ref 70–99)
Potassium: 4.1 meq/L (ref 3.5–5.1)
Sodium: 136 meq/L (ref 135–145)
Total Bilirubin: 0.5 mg/dL (ref 0.2–1.2)
Total Protein: 7.8 g/dL (ref 6.0–8.3)

## 2023-10-28 LAB — POCT URINALYSIS DIPSTICK
Bilirubin, UA: NEGATIVE
Blood, UA: NEGATIVE
Glucose, UA: NEGATIVE
Ketones, UA: NEGATIVE
Leukocytes, UA: NEGATIVE
Nitrite, UA: NEGATIVE
Protein, UA: NEGATIVE
Spec Grav, UA: 1.025 (ref 1.010–1.025)
Urobilinogen, UA: 0.2 U/dL
pH, UA: 6 (ref 5.0–8.0)

## 2023-10-28 LAB — TSH: TSH: 1.84 u[IU]/mL (ref 0.35–5.50)

## 2023-10-28 LAB — HEMOGLOBIN A1C: Hgb A1c MFr Bld: 5.8 % (ref 4.6–6.5)

## 2023-10-28 LAB — T4, FREE: Free T4: 0.8 ng/dL (ref 0.60–1.60)

## 2023-10-28 NOTE — Progress Notes (Signed)
 Established Patient Office Visit   Subjective  Patient ID: Alexa Hicks, female    DOB: 04-05-1990  Age: 33 y.o. MRN: [id number]  Chief Complaint  Patient presents with   Annual Exam    Pt is a 33 yo female seen for CPE.  Pt doing well.  Endorses urinary frequency x 3 days.  Denies dysuria, n/v, back pain, fever, constipation.  LMP 10/13/23.    Patient Active Problem List   Diagnosis Date Noted   NSVD (normal spontaneous vaginal delivery) 06/18/2021   Encounter for induction of labor 06/17/2021   Chronic iron deficiency anemia 05/31/2021   Pelvic inflammatory disease (PID) 08/28/2019   H/O seasonal allergies 09/14/2017   Nexplanon in place 09/14/2017   Past Medical History:  Diagnosis Date   Frequent headaches    Hay fever    Past Surgical History:  Procedure Laterality Date   NO PAST SURGERIES     Social History   Tobacco Use   Smoking status: Never   Smokeless tobacco: Never  Vaping Use   Vaping status: Never Used  Substance Use Topics   Alcohol use: Yes    Alcohol/week: 2.0 standard drinks of alcohol    Types: 2 Standard drinks or equivalent per week   Drug use: No   Family History  Problem Relation Age of Onset   Cancer Maternal Grandfather    Early death Maternal Grandfather    Arthritis Maternal Grandmother    Diabetes Maternal Grandmother    Stroke Maternal Grandmother    Early death Paternal Grandmother    Early death Paternal Grandfather    No Known Allergies  ROS Negative unless stated above    Objective:     BP 122/80 (BP Location: Left Arm, Patient Position: Sitting, Cuff Size: Normal)   Pulse 91   Temp 97.6 F (36.4 C) (Oral)   Ht 5' 7 (1.702 m)   Wt 137 lb 9.6 oz (62.4 kg)   LMP 10/13/2023 (Exact Date)   SpO2 99%   BMI 21.55 kg/m  BP Readings from Last 3 Encounters:  10/28/23 122/80  06/24/23 118/74  04/15/23 123/80   Wt Readings from Last 3 Encounters:  10/28/23 137 lb 9.6 oz (62.4 kg)  06/24/23 133 lb (60.3  kg)  04/15/23 135 lb 9.6 oz (61.5 kg)      Physical Exam Constitutional:      Appearance: Normal appearance.  HENT:     Head: Normocephalic and atraumatic.     Right Ear: Tympanic membrane, ear canal and external ear normal.     Left Ear: Tympanic membrane, ear canal and external ear normal.     Nose: Nose normal.     Mouth/Throat:     Mouth: Mucous membranes are moist.     Pharynx: No oropharyngeal exudate or posterior oropharyngeal erythema.  Eyes:     General: No scleral icterus.    Extraocular Movements: Extraocular movements intact.     Conjunctiva/sclera: Conjunctivae normal.     Pupils: Pupils are equal, round, and reactive to light.  Neck:     Thyroid : No thyromegaly.     Vascular: No carotid bruit.  Cardiovascular:     Rate and Rhythm: Normal rate and regular rhythm.     Pulses: Normal pulses.     Heart sounds: Normal heart sounds. No murmur heard.    No friction rub.  Pulmonary:     Effort: Pulmonary effort is normal.     Breath sounds: Normal breath sounds. No wheezing, rhonchi  or rales.  Abdominal:     General: Bowel sounds are normal.     Palpations: Abdomen is soft.     Tenderness: There is no abdominal tenderness.  Musculoskeletal:        General: Normal range of motion.  Lymphadenopathy:     Cervical: No cervical adenopathy.  Skin:    General: Skin is warm and dry.     Findings: No lesion.  Neurological:     General: No focal deficit present.     Mental Status: She is alert and oriented to person, place, and time.  Psychiatric:        Mood and Affect: Mood normal.        Thought Content: Thought content normal.        10/28/2023   11:24 AM 04/15/2023   10:59 AM 04/15/2023   10:49 AM  Depression screen PHQ 2/9  Decreased Interest 0 0 0  Down, Depressed, Hopeless 0 0 0  PHQ - 2 Score 0 0 0  Altered sleeping 0 0 0  Tired, decreased energy 0 0 0  Change in appetite 0 0 0  Feeling bad or failure about yourself  0 0 0  Trouble concentrating 0 0  0  Moving slowly or fidgety/restless 0 0 0  Suicidal thoughts 0 0 0  PHQ-9 Score 0  0  0      Data saved with a previous flowsheet row definition      10/28/2023   11:24 AM 04/15/2023   10:59 AM 04/15/2023   10:49 AM  GAD 7 : Generalized Anxiety Score  Nervous, Anxious, on Edge 0 1 1  Control/stop worrying 0 0 0  Worry too much - different things 0 1 1  Trouble relaxing 0 0 0  Restless 0 0 0  Easily annoyed or irritable 0 0 0  Afraid - awful might happen 0 0 0  Total GAD 7 Score 0 2 2  Anxiety Difficulty  Not difficult at all Not difficult at all     POC Urinalysis Dipstick  Result Value Ref Range   Color, UA yellow    Clarity, UA clear    Glucose, UA Negative Negative   Bilirubin, UA neg    Ketones, UA neg    Spec Grav, UA 1.025 1.010 - 1.025   Blood, UA neg    pH, UA 6.0 5.0 - 8.0   Protein, UA Negative Negative   Urobilinogen, UA 0.2 0.2 or 1.0 E.U./dL   Nitrite, UA neg    Leukocytes, UA Negative Negative   Appearance     Odor        Assessment & Plan:   Well adult exam -     CBC with Differential/Platelet; Future -     Comprehensive metabolic panel with GFR; Future -     Hemoglobin A1c; Future -     Lipid panel; Future -     T4, free; Future -     TSH; Future -     POCT urinalysis dipstick  Urine frequency -     POCT urinalysis dipstick  Age-appropriate health screenings discussed.  Obtain labs.  POC UA for UTI.  Patient encouraged to increase intake of water and fluids.  Avoid foods or increased caffeine which may irritate bladder.  Hgb A1C to evaluate for possible DM.  Immunizations reviewed.  Influenza vaccine recommended.  Patient declines at this time.  Pap in July with [facility].  Colonoscopy and mammogram not yet indicated 2/2  age.  Return if symptoms worsen or fail to improve.   Clotilda JONELLE Single, MD

## 2023-11-01 ENCOUNTER — Encounter: Payer: Self-pay | Admitting: Family Medicine

## 2023-11-03 ENCOUNTER — Telehealth: Payer: Self-pay | Admitting: *Deleted

## 2023-11-03 NOTE — Telephone Encounter (Signed)
 Copied from CRM [id number]. Topic: Clinical - Lab/Test Results >> Nov 03, 2023 12:29 PM Alexandria E wrote: Reason for CRM: Patient would like to discuss her labs that were completed on 10/24. Please call patient once labs are addressed, thank you. Patient would like to speak with PCP today if possible.

## 2023-11-04 ENCOUNTER — Ambulatory Visit: Payer: Self-pay | Admitting: Family Medicine

## 2023-11-04 DIAGNOSIS — D509 Iron deficiency anemia, unspecified: Secondary | ICD-10-CM

## 2023-11-04 MED ORDER — FERROUS GLUCONATE 324 (38 FE) MG PO TABS: 324.0000 mg | ORAL_TABLET | Freq: Every day | ORAL | 1 refills | Status: AC
